# Patient Record
Sex: Male | Born: 1965 | Race: White | Hispanic: No | Marital: Married | Smoking: Never smoker
Health system: Southern US, Community
[De-identification: ages and names within clinical notes are randomized; demographics above are authoritative.]

## PROBLEM LIST (undated history)

## (undated) DIAGNOSIS — G43909 Migraine, unspecified, not intractable, without status migrainosus: Secondary | ICD-10-CM

## (undated) DIAGNOSIS — T7840XA Allergy, unspecified, initial encounter: Secondary | ICD-10-CM

## (undated) DIAGNOSIS — I10 Essential (primary) hypertension: Secondary | ICD-10-CM

## (undated) DIAGNOSIS — K219 Gastro-esophageal reflux disease without esophagitis: Secondary | ICD-10-CM

## (undated) DIAGNOSIS — R51 Headache: Secondary | ICD-10-CM

## (undated) DIAGNOSIS — R519 Headache, unspecified: Secondary | ICD-10-CM

## (undated) DIAGNOSIS — J45909 Unspecified asthma, uncomplicated: Secondary | ICD-10-CM

## (undated) HISTORY — DX: Headache: R51

## (undated) HISTORY — DX: Migraine, unspecified, not intractable, without status migrainosus: G43.909

## (undated) HISTORY — DX: Gastro-esophageal reflux disease without esophagitis: K21.9

## (undated) HISTORY — DX: Essential (primary) hypertension: I10

## (undated) HISTORY — PX: UVULOPALATOPHARYNGOPLASTY: SHX827

## (undated) HISTORY — DX: Headache, unspecified: R51.9

## (undated) HISTORY — DX: Allergy, unspecified, initial encounter: T78.40XA

## (undated) HISTORY — PX: OTHER SURGICAL HISTORY: SHX169

## (undated) HISTORY — DX: Unspecified asthma, uncomplicated: J45.909

---

## 2005-02-22 ENCOUNTER — Ambulatory Visit (HOSPITAL_BASED_OUTPATIENT_CLINIC_OR_DEPARTMENT_OTHER): Admission: RE | Admit: 2005-02-22 | Discharge: 2005-02-22 | Payer: Self-pay | Admitting: Emergency Medicine

## 2005-02-28 ENCOUNTER — Ambulatory Visit: Payer: Self-pay | Admitting: Internal Medicine

## 2006-02-10 ENCOUNTER — Encounter: Admission: RE | Admit: 2006-02-10 | Discharge: 2006-02-10 | Payer: Self-pay | Admitting: Otolaryngology

## 2006-03-18 ENCOUNTER — Ambulatory Visit (HOSPITAL_COMMUNITY): Admission: RE | Admit: 2006-03-18 | Discharge: 2006-03-20 | Payer: Self-pay | Admitting: Otolaryngology

## 2006-03-18 ENCOUNTER — Encounter (INDEPENDENT_AMBULATORY_CARE_PROVIDER_SITE_OTHER): Payer: Self-pay | Admitting: *Deleted

## 2006-06-30 ENCOUNTER — Ambulatory Visit (HOSPITAL_BASED_OUTPATIENT_CLINIC_OR_DEPARTMENT_OTHER): Admission: RE | Admit: 2006-06-30 | Discharge: 2006-06-30 | Payer: Self-pay | Admitting: Otolaryngology

## 2006-07-03 ENCOUNTER — Ambulatory Visit: Payer: Self-pay | Admitting: Internal Medicine

## 2009-05-13 ENCOUNTER — Encounter: Admission: RE | Admit: 2009-05-13 | Discharge: 2009-05-13 | Payer: Self-pay | Admitting: Orthopedic Surgery

## 2010-07-26 ENCOUNTER — Encounter: Payer: Self-pay | Admitting: Emergency Medicine

## 2010-08-18 ENCOUNTER — Other Ambulatory Visit: Payer: Self-pay | Admitting: Orthopedic Surgery

## 2010-08-18 DIAGNOSIS — M21371 Foot drop, right foot: Secondary | ICD-10-CM

## 2010-08-20 ENCOUNTER — Ambulatory Visit
Admission: RE | Admit: 2010-08-20 | Discharge: 2010-08-20 | Disposition: A | Discharge status: Home / Self Care | Payer: No Typology Code available for payment source | Source: Ambulatory Visit | Attending: Orthopedic Surgery | Admitting: Orthopedic Surgery

## 2010-08-20 DIAGNOSIS — M21371 Foot drop, right foot: Secondary | ICD-10-CM

## 2010-11-20 NOTE — Op Note (Signed)
NAME:  Anthony Whitaker, Anthony Whitaker                 ACCOUNT NO.:  [id number]   MEDICAL RECORD NO.:  [id number]          PATIENT TYPE:  OIB   LOCATION:  2111                         FACILITY:  [facility]   PHYSICIAN:  Antony Contras, MD     DATE OF BIRTH:  11/29/1965   DATE OF PROCEDURE:  03/18/2006  DATE OF DISCHARGE:                                 OPERATIVE REPORT   PREOPERATIVE DIAGNOSES:  1. Moderately severe obstructive sleep apnea  2. Septal deviation.  3. Inferior turbinate hypertrophy.   POSTOPERATIVE DIAGNOSES:  1. Moderately severe obstructive sleep apnea  2. Septal deviation.  3. Inferior turbinate hypertrophy.   PROCEDURE:  1. Nasal septoplasty.  2. Uvulopalatopharyngoplasty with tonsillectomy.  3. Hyoid suspension.  4. Genioglossus advancement.  5. Submucous resection of the inferior turbinates bilaterally.   SURGEON:  Dr. Christia Reading.   ANESTHESIA:  General endotracheal anesthesia.   COMPLICATIONS:  None.   INDICATIONS:  The patient is a 45 year old white male with a history of  moderately severe obstructive sleep apnea with a respiratory disturbance  index of about 44.  He has tried CPAP for many months and find the mask to  be intolerable.  He thus presents to the operating room for surgical  management of his moderately severe obstructive sleep apnea.   FINDINGS:  The patient has a small mouth, large tongue and a long soft  palate.  Tonsils were 2+ in size bilaterally.  The neck is thick, and he is  moderately obese.   DESCRIPTION OF PROCEDURE:  The patient is identified in the holding room,  and informed consent having been obtained, the patient was moved to the  operative suite and put on the operating table in supine position.  Anesthesia was induced, and the patient was intubated by anesthesia team  without difficulty.  The patient was given intravenous antibiotics and  steroids during the case.  The eyes taped closed, and the face and neck were  prepped and draped  in sterile fashion.  The hyoid incision was then marked  with a marking pen just overlying the superior thyroid cartilage.  This was  injected with 1% lidocaine with 1:100,000 of epinephrine.  The incision in  the skin was then made with a 15 blade scalpel and extended through the  subcutaneous fat using Bovie electrocautery.  The platysma muscle was  retracted to each direction, and the midline was divided down to the thyroid  cartilage.  The hyoid bone was then dissected with Bovie electrocautery  allowing it to be grasped.  The lesser cornu on each side was dissected in  order to allow mobilization of the hyoid anteriorly.  The thyroid cartilage  was then also cleared off of soft tissues.  The hyoid was then retracted  anteriorly and a #2 Ethibond suture was then used to make 4 lassoing sutures  around the hyoid bone and also through the superior thyroid cartilage.  Two  were placed on each side.  These were then tightened down, pulling the hyoid  bone anteriorly.  The surgical site was copiously irrigated with saline, and  a 7-French suction drain was then placed through a stab incision inferior  and to the left of the incision.  This was secured to the skin using a 3-0  nylon in a standard running stitch.  The midline was then closed with 3-0  Vicryl in a simple interrupted fashion.  Subcutaneous layer was also closed  in the same fashion.  Skin was closed with a running 5-0 nylon.  The drain  was hooked to bulb suction for the remainder of the case.  At this point,  the inferior gingivobuccal sulcus between the canine teeth was injected with  1% lidocaine with 1:100,000 epinephrine.  Incision was made through the  mucosa leaving a cuff of tissue to sew to using Bovie electrocautery.  This  was extended down through soft tissues directly onto the bone.  A Hurd  elevator was then used to elevate the soft tissues off of the inferior  mandible down to its margin.  Bleeding was controlled  with Bovie  electrocautery.  With the lip retracted inferiorly, a rectangular-shaped  osteotomy was then made with an oscillating saw with the lateral extent  being medial to the canines and the superior extent being well inferior to  the length of the incisors.  A strut of bone was maintained inferiorly as  well.  The rectangular-shaped piece of bone was then elevated anteriorly.  In doing so, the outer cortex was removed and discarded.  The inner cortex  was further elevated until it was able to be twisted in a 90-degree  orientation, locking it onto the outer cortex of the remaining mandible.  A  cutting bur was then used to bur down the bone until it was a fairly flat  piece of cancellous bone.  A drill bit for the 2.0 Leibinger mandible set  was then used to make a single drill hole superiorly and inferiorly on the  segment of bone and through the native mandible.  A depth gauge was used,  and appropriately length bone screws were then inserted and used 2 drill  holes securing the segment of bone.  The dissection area was then copiously  irrigated with saline.  Incision was closed with 3-0 Monocryl in a simple  running fashion.  Earlier in the case, Afrin-soaked pledgets were placed in  each side of the nose.  At this point, the pledgets were removed, and the  septum was injected on both sides of 1% lidocaine with 1:100,000  epinephrine.  A Killian incision was made on the right side using 15 blade  scalpel and extended down to the subperichondrial plane.  A subperichondrial  flap was then elevated using a Risk analyst followed by a Therapist, nutritional.  This was done back to the bony cartilaginous junction and beyond.  It was  also extended inferiorly to over the inferior spur.  The bony cartilaginous  junction was then divided using a Therapist, nutritional, and the subperiosteal flap  was elevated on the opposite side.  A segment of the posterior septal bone was then removed using a rongeur  followed by Lenoria Chime forceps.  An  osteotome was then used to remove the spur on the right side as well as the  inferior septal bone.  The incision anteriorly was then extended around the  caudal edge of the septal bone, and a subperichondrial flap was elevated on  the opposite side as well.  The flap was elevated down over the septal spur  inferiorly.  An osteotome was then used  to remove the septal spur.  This  allowed the septum to sit in midline without obstructing the airway.  At  this point, a stab incision was made through the right-sided flap to allow  drainage.  The incision was closed with 4-0 chromic suture in a simple  interrupted fashion.  The inferior turbinates were then injected with 1%  lidocaine with 1:100,000 of epinephrine on both sides.  A turbinate blade  microdebrider with cautery was then used, and the cautery was used to  penetrate the mucosa on the right side first.  The soft tissue was elevated  off the underlying bone using the blade.  The microdebrider was then used  simultaneously with bipolar cautery to remove submucosal tissue.  After this  was completed, the stab incision was also cauterized with the same  instrument.  The Freer elevator was used to lateralize the bone, and the  nasal passage was suctioned.  The same procedure was then carried out on the  left side in an identical fashion.  At this point, [facility] stents coated in  bacitracin were placed inside each side of the nose.  They were secured at  the anterior septum using a single 2-0 chromic suture and a mattress stitch.  At this point, the bed was turned 90 degrees from anesthesia, and the  endotracheal tube was loosened.  A Crowe-Davis retractor was then inserted  in the mouth and opened to reveal the oropharynx.  This was placed in  suspension on the Mayo stand.  The soft palate was then palpated, and the  estimated site for UPPP was marked with Bovie electrocautery.  The right  tonsil was then  grasped with a curved Allis and retracted medially.  Bovie  electrocautery was then used to make curvilinear incision along the anterior  tonsillar pillar and into the subcapsular plane dissecting the tonsil until  it was removed.  Tonsil sponge was placed on the right side.  The same  procedure was then carried out on the left side.  Tonsils were passed to  nursing separately for pathology.  The packs were then removed and bleeding  and superficial vessels cauterized using suction cautery on a setting of 30.  At this point, the uvula and soft palate were injected with 1% lidocaine  with 1:100,000 of epinephrine.  The anterior soft palate incision was then  extended to each tonsillar fossa using a 15 blade scalpel from the mark that  was made previously.  The soft palate tissues deep to the mucosa were then  incised with the 15 blade scalpel.  The incision was then skived inferiorly  through the posterior edge using the same instrument.  Back cuts were then made at the top of each tonsillar fossa and the mucosa using the blade.  A 3-  0 Vicryl suture was then used in a simple interrupted fashion to close the  posterior edge of soft palate to the anterior edge as well as starting by  tucking the corners up to the back cuts that were made.  Tonsillar fossae  were closed on both sides part way down as well.  After this, the nose and  throat were copiously irrigated with saline and suctioned out.  A  nasogastric tube was passed down the esophagus to suck out the stomach and  esophagus.  The Crowe-Davis retractor was then taken out of suspension and  removed from patient's mouth.  He was returned back to anesthesia for wake-  up and was extubated  and moved to the recovery room in stable condition.      Antony Contras, MD  Electronically Signed     DDB/MEDQ  D:  03/18/2006  T:  03/19/2006  Job:  [id number]

## 2010-11-20 NOTE — Procedures (Signed)
NAME:  Anthony Whitaker, Anthony Whitaker                 ACCOUNT NO.:  [id number]   MEDICAL RECORD NO.:  [id number]          PATIENT TYPE:  OUT   LOCATION:  SLEEP CENTER                 FACILITY:  [facility]   PHYSICIAN:  Clinton D. Maple Hudson, M.D. DATE OF BIRTH:  March 18, 1966   DATE OF STUDY:  02/22/2005                              NOCTURNAL POLYSOMNOGRAM   REFERRING PHYSICIAN:  Dr. Earl Lites   DATE OF STUDY:  February 22, 2005   INDICATION FOR STUDY:  Hypersomnia with sleep apnea. Epworth Sleepiness  Score 7/24, BMI 40, weight 300 pounds.   SLEEP ARCHITECTURE:  Total sleep time 366 minutes with sleep efficiency 85%.  Stage I 10%, stage II 60%, stages III and IV 17%, REM 13% of total sleep  time. Sleep latency 30 minutes, REM latency 217 minutes, awake after sleep  onset 33 minutes, arousal index 19. No bedtime medication.   RESPIRATORY DATA:  Split study protocol. Apnea-hypopnea index (AHI, RDI)  44.8 obstructive events per hour indicating moderately severe obstructive  sleep apnea/hypopnea syndrome before CPAP. This included 1 obstructive  apnea, 1 mixed apnea and 92 hypopneas before CPAP. He slept only supine. REM  RDI 6.2. CPAP was titrated to 17 CWP, AHI 5.2 per hour, using a medium  ResMed Ultra Mirage Full Face Mask with heated humidifier.   OXYGEN DATA:  Loud snoring and mouth breathing with oxygen desaturation to a  nadir of 67% on room air before CPAP. After CPAP control saturation held 94-  98% on room air.   CARDIAC DATA:  Sinus rhythm with frequent PVCs.   MOVEMENT/PARASOMNIA:  Occasional leg jerk with little effect on sleep. No  bathroom trips.   IMPRESSION/RECOMMENDATION:  1.  Moderately severe obstructive sleep apnea/hypopnea syndrome, apnea-      hypopnea index 44.8 per hour with loud snoring and oxygen desaturation      to 67%.  2.  Continuous positive airway pressure titration to 17 CWP, apnea-hypopnea      index 5.2 per hour, using a medium      ResMed Ultra Mirage Full Face Mask with  heated humidifier.  3.  Frequent PVCs.      Clinton D. Maple Hudson, M.D.  Diplomate, Biomedical engineer of Sleep Medicine  Electronically Signed     CDY/MEDQ  D:  02/28/2005 11:07:47  T:  02/28/2005 22:59:54  Job:  [id number]

## 2010-11-20 NOTE — Procedures (Signed)
NAME:  Anthony Whitaker, Anthony Whitaker                 ACCOUNT NO.:  [id number]   MEDICAL RECORD NO.:  [id number]          PATIENT TYPE:  OUT   LOCATION:  SLEEP CENTER                 FACILITY:  [facility]   PHYSICIAN:  Clinton D. Maple Hudson, MD, FCCP, FACPDATE OF BIRTH:  [birth year]-Sep[birth year]1967   DATE OF STUDY:  06/30/2006                            NOCTURNAL POLYSOMNOGRAM   INDICATION FOR STUDY:  Hypersomnia with sleep apnea.   EPWORTH SLEEPINESS SCORE:  11/24.   BMI:  41.9.  Weight 300 pounds.   HOME MEDICATIONS:  Advair, Singulair, Zegerid, Flonase.   SLEEP ARCHITECTURE:  Total sleep time 360 minutes, with sleep efficiency  83%.  Stage I was 7%, stage II 65%, stages III and IV 13%, REM 15% of  total sleep time.  Sleep latency 42 minutes, REM latency 205 minutes,  awake after sleep onset 31 minutes, arousal index 20.8.  No bedtime  medication was taken.   RESPIRATORY DATA:  Split study protocol.  Apnea-hypopnea index (AHI,  RDI) 98.5 obstructive events per hour, indicating severe obstructive  sleep apnea/hypopnea syndrome before CPAP.  This included 211  obstructive apneas and 4 hypopneas before CPAP.  Events were equally  common while supine and sleeping on left side.  REM AHI 3.3 per hour.  CPAP was titrated to 13 CWP, AHI 17.5 per hour.  Optimum control was  obtained at 9 CWP, AHI 0 per hour.  The patient used his own full face  Ultra Mirage mask.  Heated humidifier was added.   OXYGEN DATA:  Moderate snoring, with oxygen saturation to a nadir of  81%.  Mean oxygen saturation during CPAP control as 95% on room air.   CARDIAC DATA:  Normal sinus rhythm.   MOVEMENT-PARASOMNIA:  Rare limb jerk, insignificant.   IMPRESSIONS-RECOMMENDATIONS:  1. Severe obstructive sleep apnea/hypopnea syndrome, AHI 98.5 per      hour, with non-positional events, moderate snoring, and oxygen      desaturation to 81%.  2. CPAP was titrated to 13 CWP.  Apnea index was climbing as pressures      were increased.  Optimum pressure  appeared to be 9 CWP, AHI 0 per hour.  He used his own medium full      face Ultra Mirage mask, and a heated humidifier was applied.      Clinton D. Maple Hudson, MD, [facility], FACP  Diplomate, Biomedical engineer of Sleep Medicine  Electronically Signed     CDY/MEDQ  D:  07/03/2006 16:31:38  T:  07/04/2006 08:33:51  Job:  [id number]

## 2012-03-25 ENCOUNTER — Ambulatory Visit: Payer: Self-pay | Admitting: Emergency Medicine

## 2012-03-25 ENCOUNTER — Encounter: Payer: Self-pay | Admitting: Emergency Medicine

## 2012-03-25 VITALS — BP 128/74 | HR 55 | Temp 98.0°F | Resp 16 | Ht 71.25 in | Wt 297.2 lb

## 2012-03-25 DIAGNOSIS — E663 Overweight: Secondary | ICD-10-CM

## 2012-03-25 DIAGNOSIS — I10 Essential (primary) hypertension: Secondary | ICD-10-CM

## 2012-03-25 DIAGNOSIS — J45909 Unspecified asthma, uncomplicated: Secondary | ICD-10-CM

## 2012-03-25 DIAGNOSIS — Z23 Encounter for immunization: Secondary | ICD-10-CM

## 2012-03-25 MED ORDER — BECLOMETHASONE DIPROPIONATE 80 MCG/ACT IN AERS: 2.0000 | INHALATION_SPRAY | RESPIRATORY_TRACT | Status: DC | PRN

## 2012-03-25 MED ORDER — METOPROLOL TARTRATE 25 MG PO TABS: 25.0000 mg | ORAL_TABLET | Freq: Two times a day (BID) | ORAL | Status: DC

## 2012-03-25 MED ORDER — ALBUTEROL SULFATE HFA 108 (90 BASE) MCG/ACT IN AERS: 2.0000 | INHALATION_SPRAY | RESPIRATORY_TRACT | Status: DC | PRN

## 2012-03-25 NOTE — Progress Notes (Signed)
  Subjective:    Patient ID: Anthony Whitaker, male    DOB: 16-Nov-1965, 46 y.o.   MRN: [id number]  HPI Pt rtc for hypertension f/u and medication refill. Feeling well, blood pressure has been normal,  denies chest pain, sob, Works in office, very limited physical exercise.  bp rechecked in room: 118/80   Review of Systems patient does have a history of sleep apnea. He's had sleep apnea surgery he states he has not had any followup testing done but does feel markedly improved after having had surgery done. He did have questions regarding taking a beta blocker and questions as to whether felt like this was the source of his fatigue     Objective:   Physical Exam HEENT exam unremarkable chest is clear . Heart regular no murmurs rubs or gallops appreciated. Abdomen is soft nontender extremities without edema        Assessment & Plan:  Blood pressure under good control on current medications. Will refill meds for a year. We will place for future we're going to have a CBC, cmet,  lipids in the near future

## 2012-03-25 NOTE — Patient Instructions (Addendum)

## 2012-04-15 ENCOUNTER — Ambulatory Visit: Payer: Self-pay | Admitting: Emergency Medicine

## 2012-04-15 DIAGNOSIS — E663 Overweight: Secondary | ICD-10-CM

## 2012-04-15 DIAGNOSIS — I10 Essential (primary) hypertension: Secondary | ICD-10-CM

## 2012-04-15 LAB — POCT CBC
Granulocyte percent: 66.3 %G (ref 37–80)
MCV: 93.7 fL (ref 80–97)
MID (cbc): 0.8 (ref 0–0.9)
MPV: 10.9 fL (ref 0–99.8)
POC Granulocyte: 5.8 (ref 2–6.9)
POC LYMPH PERCENT: 24.6 %L (ref 10–50)
POC MID %: 9.1 %M (ref 0–12)
Platelet Count, POC: 313 10*3/uL (ref 142–424)
RBC: 4.86 M/uL (ref 4.69–6.13)
RDW, POC: 12.4 %

## 2012-04-15 LAB — COMPREHENSIVE METABOLIC PANEL
ALT: 22 U/L (ref 0–53)
AST: 17 U/L (ref 0–37)
Albumin: 4 g/dL (ref 3.5–5.2)
Alkaline Phosphatase: 54 U/L (ref 39–117)
BUN: 18 mg/dL (ref 6–23)
Calcium: 9.3 mg/dL (ref 8.4–10.5)
Chloride: 106 mEq/L (ref 96–112)
Potassium: 4.6 mEq/L (ref 3.5–5.3)
Sodium: 142 mEq/L (ref 135–145)
Total Protein: 6.4 g/dL (ref 6.0–8.3)

## 2012-04-15 LAB — POCT GLYCOSYLATED HEMOGLOBIN (HGB A1C): Hemoglobin A1C: 5.5

## 2012-04-15 LAB — LIPID PANEL
HDL: 28 mg/dL — ABNORMAL LOW (ref >39)
LDL Cholesterol: 92 mg/dL (ref 0–99)

## 2012-04-17 ENCOUNTER — Encounter: Payer: Self-pay | Admitting: Physician Assistant

## 2013-02-01 ENCOUNTER — Ambulatory Visit (INDEPENDENT_AMBULATORY_CARE_PROVIDER_SITE_OTHER): Payer: BC Managed Care – PPO | Admitting: Family Medicine

## 2013-02-01 ENCOUNTER — Encounter: Payer: Self-pay | Admitting: Family Medicine

## 2013-02-01 VITALS — BP 120/80 | HR 64 | Temp 97.7°F | Ht 71.75 in | Wt 308.6 lb

## 2013-02-01 DIAGNOSIS — M129 Arthropathy, unspecified: Secondary | ICD-10-CM

## 2013-02-01 DIAGNOSIS — H9190 Unspecified hearing loss, unspecified ear: Secondary | ICD-10-CM

## 2013-02-01 DIAGNOSIS — J45909 Unspecified asthma, uncomplicated: Secondary | ICD-10-CM

## 2013-02-01 DIAGNOSIS — I10 Essential (primary) hypertension: Secondary | ICD-10-CM

## 2013-02-01 DIAGNOSIS — M47812 Spondylosis without myelopathy or radiculopathy, cervical region: Secondary | ICD-10-CM

## 2013-02-01 DIAGNOSIS — H9193 Unspecified hearing loss, bilateral: Secondary | ICD-10-CM

## 2013-02-01 DIAGNOSIS — J4520 Mild intermittent asthma, uncomplicated: Secondary | ICD-10-CM

## 2013-02-01 MED ORDER — MELOXICAM 15 MG PO TABS: 15.0000 mg | ORAL_TABLET | Freq: Every day | ORAL | Status: DC

## 2013-02-01 MED ORDER — METOPROLOL TARTRATE 25 MG PO TABS: 25.0000 mg | ORAL_TABLET | Freq: Two times a day (BID) | ORAL | Status: DC

## 2013-02-01 MED ORDER — ALBUTEROL SULFATE HFA 108 (90 BASE) MCG/ACT IN AERS: 2.0000 | INHALATION_SPRAY | RESPIRATORY_TRACT | Status: DC | PRN

## 2013-02-01 MED ORDER — BECLOMETHASONE DIPROPIONATE 80 MCG/ACT IN AERS: 2.0000 | INHALATION_SPRAY | RESPIRATORY_TRACT | Status: DC | PRN

## 2013-02-01 NOTE — Progress Notes (Signed)
  Subjective:    Patient ID: Anthony Whitaker, male    DOB: Oct 19, 1965, 47 y.o.   MRN: [id number]  HPI New to establish.  Previous MD- Daub.  Hearing- has regular hearing tests at work and last test showed notable hearing loss bilaterally.  Pt now having increased difficulty w/ 'certain frequencies'- 'pretty much human speech'.  Cervical bone spur- previously was seeing Dr Shon Baton, C4/C5.  Now having increased numbness of UEs bilaterally and weakness bilaterally but L >R.  Not currently taking anything for inflammation or pain.  HTN- chronic problem, on Metoprolol twice daily.  Denies abd pain, N/V, SOB, HAs visual changes, edema.   Review of Systems For ROS see HPI     Objective:   Physical Exam  Vitals reviewed. Constitutional: He is oriented to person, place, and time. He appears well-developed and well-nourished. No distress.  HENT:  Head: Normocephalic and atraumatic.  Eyes: Conjunctivae and EOM are normal. Pupils are equal, round, and reactive to light.  Neck: Normal range of motion. Neck supple. No thyromegaly present.  Cardiovascular: Normal rate, regular rhythm, normal heart sounds and intact distal pulses.   No murmur heard. Pulmonary/Chest: Effort normal and breath sounds normal. No respiratory distress.  Abdominal: Soft. Bowel sounds are normal. He exhibits no distension.  Musculoskeletal: He exhibits no edema.  Lymphadenopathy:    He has no cervical adenopathy.  Neurological: He is alert and oriented to person, place, and time. He has normal reflexes. No cranial nerve deficit.  Skin: Skin is warm and dry.  Psychiatric: He has a normal mood and affect. His behavior is normal.          Assessment & Plan:

## 2013-02-01 NOTE — Patient Instructions (Signed)
Schedule your complete physical in 6 months We'll call you with your ortho appt Start the Mobic daily for neck pain/inflammation We'll call you with your audiology appt We'll notify you of your lab results Call with any questions or concerns Think of [place] as your home base Welcome!  We're glad to have you!!!

## 2013-02-02 LAB — HEPATIC FUNCTION PANEL
AST: 28 U/L (ref 0–37)
Alkaline Phosphatase: 51 U/L (ref 39–117)
Bilirubin, Direct: 0 mg/dL (ref 0.0–0.3)
Total Protein: 7 g/dL (ref 6.0–8.3)

## 2013-02-02 LAB — CBC WITH DIFFERENTIAL/PLATELET
Basophils Absolute: 0.1 10*3/uL (ref 0.0–0.1)
Eosinophils Relative: 7.7 % — ABNORMAL HIGH (ref 0.0–5.0)
HCT: 39.6 % (ref 39.0–52.0)
Hemoglobin: 13.3 g/dL (ref 13.0–17.0)
Lymphocytes Relative: 28.2 % (ref 12.0–46.0)
Monocytes Relative: 9.3 % (ref 3.0–12.0)
Neutro Abs: 3.8 10*3/uL (ref 1.4–7.7)
RDW: 12.9 % (ref 11.5–14.6)
WBC: 7 10*3/uL (ref 4.5–10.5)

## 2013-02-02 LAB — LIPID PANEL
HDL: 29.3 mg/dL — ABNORMAL LOW (ref >39.00)
LDL Cholesterol: 109 mg/dL — ABNORMAL HIGH (ref 0–99)
Total CHOL/HDL Ratio: 6
Triglycerides: 145 mg/dL (ref 0.0–149.0)

## 2013-02-02 LAB — BASIC METABOLIC PANEL
CO2: 27 mEq/L (ref 19–32)
Chloride: 105 mEq/L (ref 96–112)
Glucose, Bld: 98 mg/dL (ref 70–99)
Sodium: 139 mEq/L (ref 135–145)

## 2013-02-03 NOTE — Assessment & Plan Note (Signed)
New to provider, ongoing for pt.  Well controlled today.  Refill meds.  Check labs to risk stratify.  Will follow.

## 2013-02-03 NOTE — Assessment & Plan Note (Signed)
New.  Refer to audiology for complete evaluation and possible tx.

## 2013-02-03 NOTE — Assessment & Plan Note (Signed)
New to provider, ongoing for pt.  Refer back to Dr Shon Baton and possibly neurosurg in the future.  Start once daily anti-inflammatory

## 2013-02-03 NOTE — Assessment & Plan Note (Signed)
New to provider, ongoing for pt.  Refill provided on inhalers.

## 2013-03-12 ENCOUNTER — Ambulatory Visit: Payer: BC Managed Care – PPO | Attending: Family Medicine | Admitting: Audiology

## 2013-03-12 DIAGNOSIS — H905 Unspecified sensorineural hearing loss: Secondary | ICD-10-CM

## 2013-03-12 DIAGNOSIS — H906 Mixed conductive and sensorineural hearing loss, bilateral: Secondary | ICD-10-CM | POA: Diagnosis not present

## 2013-03-12 DIAGNOSIS — H903 Sensorineural hearing loss, bilateral: Secondary | ICD-10-CM
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
## 2013-03-12 NOTE — Patient Instructions (Addendum)
Normal hearing in the low and high frequencies with a mid range hearing loss bilaterally. Excellent word recognition in quiet that becomes fair in the right ear in minimal background noise.  Inexpensive Auditory processing self-help computer programs are now available for IPAD and computer download, more are being developed.  Benenfit has been shown with intensive use for 10-15 minutes,  4-5 days per week for 5-8 weeks for each of these programs.  Research is suggesting that using the programs for a short amount of time each day is better for the auditory processing development than completing the program in a short amount of time by doing it several hours per day. Auditory Workout          IPAD only from [email or web address]  IPAD or PC download ( Auditory memory which includes hearing in background noise sessions)        To help monitor progress at home please go to www.hear-it.org . Take the "hearing test" which has varying background noise before starting therapy and then again later.  Recent research has shown the hearing test valid for monitoring.  If no significant improvement, please contact me for further testing and/or recommendations.  Additional testing and or other auditory processing interventions may be needed or be more effective.  I Strategies that help improve hearing include: A) Face the speaker directly. Optimal is having the speakers face well - lit.  Unless amplified, being within 3-6 feet of the speaker will enhance word recognition. B) Avoid having the speaker back-lit as this will minimize the ability to use cues from lip-reading, facial expression and gestures. C)  Word recognition is poorer in background noise. For optimal word recognition, turn off the TV, radio or noisy fan when engaging in conversation. In a restaurant, try to sit away from noise sources and close to the primary speaker.  D)  Ask for topic clarification from time to time in order to remain in the  conversation.  Most people don't mind repeating or clarifying a point when asked.  If needed, explain the difficulty hearing in background noise or hearing loss.  Amplification helps make the signal louder and therefore often improves hearing and word recognition.  Amplification has many forms including hearing aids in one or both ears, an assistive listening device which have a microphone and speaker such as a small handheld device and/or even a surround sound system of speakers.  Amplification may be covered by some insurances, but not all.  It is important to note that hearing aids must be individually fit according to the hearing test results and the ear shape.  Audiologists and hearing aid dealers in [place] must be licensed in order to dispense hearing aids.  In addition, a trial period is mandated by law in our state because often amplification must be tried and then evaluated in order to determine benefit.  There are many excellent choices when it comes to amplification in our area and providers are listed in the phone book under hearing aids, may be affiliated with Ear, Nose and Throat physicians, are located at [facility] and [employer] as well as the [employer] [facility] speech and hearing center.  Hearing Loss A hearing loss is sometimes called deafness. Hearing loss may be partial or total. CAUSES Hearing loss may be caused by:  Wax in the ear canal.  Infection of the ear canal.  Infection of the middle ear.  Trauma to the ear or surrounding area.  Fluid in the middle ear.  A  hole in the eardrum (perforated eardrum).  Exposure to loud sounds or music.  Problems with the hearing nerve.  Certain medications. Hearing loss without wax, infection, or a history of injury may mean that the nerve is involved. Hearing loss with severe dizziness, nausea and vomiting or ringing in the ear may suggest a hearing nerve irritation or problems in the middle or inner ear. If hearing loss is  untreated, there is a greater likelihood for residual or permanent hearing loss. DIAGNOSIS A hearing test (audiometry) assesses hearing loss. The audiometry test needs to be performed by a hearing specialist (audiologist). TREATMENT Treatment for recent onset of hearing loss may include:  Ear wax removal.  Medications that kill germs (antibiotics).  Cortisone medications.  Prompt follow up with the appropriate specialist. Return of hearing depends on the cause of your hearing loss, so proper medical follow-up is important. Some hearing loss may not be reversible, and a caregiver should discuss care and treatment options with you. SEEK MEDICAL CARE IF:   You have a severe headache, dizziness, or changes in vision.  You have new or increased weakness.  You develop repeated vomiting or other serious medical problems.  You have a fever. Document Released: 06/21/2005 Document Revised: 09/13/2011 Document Reviewed: 10/16/2009 [facility] Patient Information 2014 [place], [place]. Hearing Loss A hearing loss is sometimes called deafness. Hearing loss may be partial or total. CAUSES Hearing loss may be caused by:  Wax in the ear canal.  Infection of the ear canal.  Infection of the middle ear.  Trauma to the ear or surrounding area.  Fluid in the middle ear.  A hole in the eardrum (perforated eardrum).  Exposure to loud sounds or music.  Problems with the hearing nerve.  Certain medications. Hearing loss without wax, infection, or a history of injury may mean that the nerve is involved. Hearing loss with severe dizziness, nausea and vomiting or ringing in the ear may suggest a hearing nerve irritation or problems in the middle or inner ear. If hearing loss is untreated, there is a greater likelihood for residual or permanent hearing loss. DIAGNOSIS A hearing test (audiometry) assesses hearing loss. The audiometry test needs to be performed by a hearing specialist  (audiologist). TREATMENT Treatment for recent onset of hearing loss may include:  Ear wax removal.  Medications that kill germs (antibiotics).  Cortisone medications.  Prompt follow up with the appropriate specialist. Return of hearing depends on the cause of your hearing loss, so proper medical follow-up is important. Some hearing loss may not be reversible, and a caregiver should discuss care and treatment options with you. SEEK MEDICAL CARE IF:   You have a severe headache, dizziness, or changes in vision.  You have new or increased weakness.  You develop repeated vomiting or other serious medical problems.  You have a fever. Document Released: 06/21/2005 Document Revised: 09/13/2011 Document Reviewed: 10/16/2009 [facility] Patient Information 2014 [place], [place].   Zaki Gertsch L. Kate Sable, Au.D., CCC-A Doctor of Audiology

## 2013-06-28 ENCOUNTER — Other Ambulatory Visit: Payer: Self-pay | Admitting: Family Medicine

## 2013-06-29 NOTE — Telephone Encounter (Signed)
Med filled.  

## 2013-07-02 ENCOUNTER — Telehealth: Payer: Self-pay | Admitting: Family Medicine

## 2013-07-02 NOTE — Telephone Encounter (Signed)
Ok for pt to receive tdap, can you schedule this and also a CPE

## 2013-07-02 NOTE — Telephone Encounter (Signed)
Pt has cpe scheduled for 08/03/13. Coming for tdap on 07/12/13.

## 2013-07-02 NOTE — Telephone Encounter (Signed)
Patient is expecting a baby in January and would like to come get tdap. Is this okay?

## 2013-07-12 ENCOUNTER — Ambulatory Visit (INDEPENDENT_AMBULATORY_CARE_PROVIDER_SITE_OTHER): Payer: BC Managed Care – PPO

## 2013-07-12 DIAGNOSIS — Z23 Encounter for immunization: Secondary | ICD-10-CM

## 2013-08-01 ENCOUNTER — Telehealth: Payer: Self-pay

## 2013-08-01 NOTE — Telephone Encounter (Signed)
Medication List and allergies:  Updated and Reviewed  90 day supply/mail order: n/a Local prescriptions: [facility] on [address]  Immunization due: UTD  A/P: No changes to personal, family or PSH Flu- 03/2013- per patient; received at work Tdap- 07/12/13 PSA- Not found in record.   To discuss with provider: Would like to discuss referral for ENT; sleep study.

## 2013-08-01 NOTE — Telephone Encounter (Signed)
Left message for call back. Identifiable  Flu-Due Tdap- 07/12/13

## 2013-08-03 ENCOUNTER — Encounter: Payer: Self-pay | Admitting: Family Medicine

## 2013-08-03 ENCOUNTER — Ambulatory Visit (INDEPENDENT_AMBULATORY_CARE_PROVIDER_SITE_OTHER): Payer: BC Managed Care – PPO | Admitting: Family Medicine

## 2013-08-03 VITALS — BP 120/80 | HR 68 | Temp 98.2°F | Resp 16 | Ht 72.0 in | Wt 308.5 lb

## 2013-08-03 DIAGNOSIS — Z0000 Encounter for general adult medical examination without abnormal findings: Secondary | ICD-10-CM

## 2013-08-03 DIAGNOSIS — G4733 Obstructive sleep apnea (adult) (pediatric): Secondary | ICD-10-CM

## 2013-08-03 LAB — CBC WITH DIFFERENTIAL/PLATELET
BASOS PCT: 0.6 % (ref 0.0–3.0)
Basophils Absolute: 0 10*3/uL (ref 0.0–0.1)
EOS ABS: 0.4 10*3/uL (ref 0.0–0.7)
Eosinophils Relative: 6.3 % — ABNORMAL HIGH (ref 0.0–5.0)
HEMATOCRIT: 45.5 % (ref 39.0–52.0)
Hemoglobin: 14.7 g/dL (ref 13.0–17.0)
LYMPHS ABS: 1.9 10*3/uL (ref 0.7–4.0)
Lymphocytes Relative: 30.7 % (ref 12.0–46.0)
MCHC: 32.4 g/dL (ref 30.0–36.0)
MCV: 91.5 fl (ref 78.0–100.0)
MONO ABS: 0.6 10*3/uL (ref 0.1–1.0)
Monocytes Relative: 10 % (ref 3.0–12.0)
NEUTROS ABS: 3.3 10*3/uL (ref 1.4–7.7)
NEUTROS PCT: 52.4 % (ref 43.0–77.0)
Platelets: 295 10*3/uL (ref 150.0–400.0)
RBC: 4.97 Mil/uL (ref 4.22–5.81)
RDW: 13.1 % (ref 11.5–14.6)
WBC: 6.2 10*3/uL (ref 4.5–10.5)

## 2013-08-03 LAB — BASIC METABOLIC PANEL
BUN: 15 mg/dL (ref 6–23)
CALCIUM: 9.8 mg/dL (ref 8.4–10.5)
CO2: 24 mEq/L (ref 19–32)
Chloride: 107 mEq/L (ref 96–112)
Creatinine, Ser: 0.9 mg/dL (ref 0.4–1.5)
GFR: 93.31 mL/min (ref >60.00)
Glucose, Bld: 87 mg/dL (ref 70–99)
POTASSIUM: 4.8 meq/L (ref 3.5–5.1)
SODIUM: 141 meq/L (ref 135–145)

## 2013-08-03 LAB — HEPATIC FUNCTION PANEL
ALT: 34 U/L (ref 0–53)
AST: 27 U/L (ref 0–37)
Albumin: 3.9 g/dL (ref 3.5–5.2)
Alkaline Phosphatase: 52 U/L (ref 39–117)
BILIRUBIN DIRECT: 0 mg/dL (ref 0.0–0.3)
BILIRUBIN TOTAL: 0.7 mg/dL (ref 0.3–1.2)
Total Protein: 6.8 g/dL (ref 6.0–8.3)

## 2013-08-03 LAB — LIPID PANEL
CHOL/HDL RATIO: 5
Cholesterol: 174 mg/dL (ref 0–200)
HDL: 34.1 mg/dL — ABNORMAL LOW (ref >39.00)
LDL Cholesterol: 125 mg/dL — ABNORMAL HIGH (ref 0–99)
TRIGLYCERIDES: 77 mg/dL (ref 0.0–149.0)
VLDL: 15.4 mg/dL (ref 0.0–40.0)

## 2013-08-03 LAB — PSA: PSA: 1.37 ng/mL (ref 0.10–4.00)

## 2013-08-03 LAB — TSH: TSH: 2.07 u[IU]/mL (ref 0.35–5.50)

## 2013-08-03 NOTE — Assessment & Plan Note (Signed)
New to provider, ongoing for pt.  Had surgery for turbinate reduction but pt feels this is no longer effective.  Not interested in CPAP machine b/c he doesn't sleep on his back.  Refer back to ENT for assessment and tx.

## 2013-08-03 NOTE — Patient Instructions (Signed)
Follow up in 6 months to recheck BP We'll notify you of your lab results and make any changes if needed Try and get regular exercise and make healthy food choices We'll call you with your ENT appt for the sleep apnea Call with any questions or concerns CONGRATS on the new baby!

## 2013-08-03 NOTE — Assessment & Plan Note (Signed)
Pt's PE WNL w/ exception of morbid obesity.  Check labs.  Anticipatory guidance provided.  

## 2013-08-03 NOTE — Assessment & Plan Note (Signed)
New to provider, ongoing for pt.  Encouraged regular, aerobic activity for 30 minutes at least 4x/week and monitoring caloric intake w/ the help of MyFitnessPal app.  

## 2013-08-03 NOTE — Progress Notes (Signed)
   Subjective:    Patient ID: Anthony Whitaker, male    DOB: [birth year], 48 y.o.   MRN: [id number]  HPI CPE- pt has hx of OSA and had nasal surgery (turbinate reduction) which 'made a huge difference but not enough'.  'they've grown back'.  Pt again having breathing pauses.     Review of Systems Patient reports no  vision/ hearing changes,anorexia, weight change, fever ,adenopathy, persistant / recurrent hoarseness, swallowing issues, chest pain,palpitations, edema,persistant / recurrent cough, hemoptysis, dyspnea(rest, exertional, paroxysmal nocturnal), gastrointestinal  bleeding (melena, rectal bleeding), abdominal pain, GU symptoms( dysuria, hematuria, pyuria, voiding/incontinence  Issues) syncope, focal weakness, memory loss,numbness & tingling, skin/hair/nail changes,depression, anxiety, abnormal bruising/bleeding, musculoskeletal symptoms/signs.  +GERD- pt reports sxs will 'build every 3-4 months', will take 2 week course of omeprazole and things will improve     Objective:   Physical Exam BP 120/80  Pulse 68  Temp(Src) 98.2 F (36.8 C) (Oral)  Resp 16  Ht 6' (1.829 m)  Wt 308 lb 8 oz (139.935 kg)  BMI 41.83 kg/m2  SpO2 96%  General Appearance:    Alert, cooperative, no distress, appears stated age, morbidly obese  Head:    Normocephalic, without obvious abnormality, atraumatic  Eyes:    PERRL, conjunctiva/corneas clear, EOM's intact, fundi    benign, both eyes       Ears:    Normal TM's and external ear canals, both ears  Nose:   Nares normal, septum midline, mucosa normal, no drainage   or sinus tenderness  Throat:   Lips, mucosa, and tongue normal; teeth and gums normal; very narrow opening between tongue and palate  Neck:   Supple, symmetrical, trachea midline, no adenopathy;       thyroid:  No enlargement/tenderness/nodules  Back:     Symmetric, no curvature, ROM normal, no CVA tenderness  Lungs:     Clear to auscultation bilaterally, respirations unlabored  Chest wall:     No tenderness or deformity  Heart:    Regular rate and rhythm, S1 and S2 normal, no murmur, rub   or gallop  Abdomen:     Soft, non-tender, bowel sounds active all four quadrants,    no masses, no organomegaly  Genitalia:    Normal male without lesion, discharge or tenderness  Rectal:    Normal tone, normal prostate, no masses or tenderness  Extremities:   Extremities normal, atraumatic, no cyanosis or edema  Pulses:   2+ and symmetric all extremities  Skin:   Skin color, texture, turgor normal, no rashes or lesions  Lymph nodes:   Cervical, supraclavicular, and axillary nodes normal  Neurologic:   CNII-XII intact. Normal strength, sensation and reflexes      throughout          Assessment & Plan:

## 2013-08-03 NOTE — Progress Notes (Signed)
Pre visit review using our clinic review tool, if applicable. No additional management support is needed unless otherwise documented below in the visit note. 

## 2013-08-06 ENCOUNTER — Encounter: Payer: Self-pay | Admitting: General Practice

## 2013-11-02 ENCOUNTER — Other Ambulatory Visit: Payer: Self-pay | Admitting: Family Medicine

## 2013-11-02 NOTE — Telephone Encounter (Signed)
Med filled.  

## 2014-01-31 ENCOUNTER — Encounter: Payer: Self-pay | Admitting: General Practice

## 2014-01-31 ENCOUNTER — Ambulatory Visit (INDEPENDENT_AMBULATORY_CARE_PROVIDER_SITE_OTHER): Payer: BC Managed Care – PPO | Admitting: Family Medicine

## 2014-01-31 ENCOUNTER — Encounter: Payer: Self-pay | Admitting: Family Medicine

## 2014-01-31 DIAGNOSIS — I10 Essential (primary) hypertension: Secondary | ICD-10-CM

## 2014-01-31 DIAGNOSIS — Z91018 Allergy to other foods: Secondary | ICD-10-CM | POA: Insufficient documentation

## 2014-01-31 DIAGNOSIS — J4520 Mild intermittent asthma, uncomplicated: Secondary | ICD-10-CM

## 2014-01-31 DIAGNOSIS — J45909 Unspecified asthma, uncomplicated: Secondary | ICD-10-CM

## 2014-01-31 LAB — BASIC METABOLIC PANEL
BUN: 17 mg/dL (ref 6–23)
CHLORIDE: 108 meq/L (ref 96–112)
CO2: 28 mEq/L (ref 19–32)
Calcium: 9.1 mg/dL (ref 8.4–10.5)
Creatinine, Ser: 1.1 mg/dL (ref 0.4–1.5)
GFR: 80.94 mL/min (ref >60.00)
Glucose, Bld: 101 mg/dL — ABNORMAL HIGH (ref 70–99)
POTASSIUM: 4.1 meq/L (ref 3.5–5.1)
Sodium: 140 mEq/L (ref 135–145)

## 2014-01-31 LAB — LIPID PANEL
Cholesterol: 168 mg/dL (ref 0–200)
HDL: 28 mg/dL — ABNORMAL LOW (ref >39.00)
LDL CALC: 118 mg/dL — AB (ref 0–99)
NONHDL: 140
Total CHOL/HDL Ratio: 6
Triglycerides: 111 mg/dL (ref 0.0–149.0)
VLDL: 22.2 mg/dL (ref 0.0–40.0)

## 2014-01-31 LAB — CBC WITH DIFFERENTIAL/PLATELET
Basophils Absolute: 0 10*3/uL (ref 0.0–0.1)
Basophils Relative: 0.6 % (ref 0.0–3.0)
Eosinophils Absolute: 0.4 10*3/uL (ref 0.0–0.7)
Eosinophils Relative: 6.3 % — ABNORMAL HIGH (ref 0.0–5.0)
HEMATOCRIT: 41.1 % (ref 39.0–52.0)
Hemoglobin: 13.6 g/dL (ref 13.0–17.0)
LYMPHS ABS: 1.6 10*3/uL (ref 0.7–4.0)
Lymphocytes Relative: 24 % (ref 12.0–46.0)
MCHC: 33.2 g/dL (ref 30.0–36.0)
MCV: 89.9 fl (ref 78.0–100.0)
MONO ABS: 0.6 10*3/uL (ref 0.1–1.0)
Monocytes Relative: 9.5 % (ref 3.0–12.0)
NEUTROS PCT: 59.6 % (ref 43.0–77.0)
Neutro Abs: 3.9 10*3/uL (ref 1.4–7.7)
Platelets: 286 10*3/uL (ref 150.0–400.0)
RBC: 4.57 Mil/uL (ref 4.22–5.81)
RDW: 12.4 % (ref 11.5–15.5)
WBC: 6.6 10*3/uL (ref 4.0–10.5)

## 2014-01-31 LAB — POCT URINALYSIS DIPSTICK
Bilirubin, UA: NEGATIVE
Blood, UA: NEGATIVE
Glucose, UA: NEGATIVE
Ketones, UA: NEGATIVE
LEUKOCYTES UA: NEGATIVE
Nitrite, UA: NEGATIVE
PH UA: 6.5
PROTEIN UA: NEGATIVE
Spec Grav, UA: <=1.005
Urobilinogen, UA: 0.2

## 2014-01-31 LAB — HEPATIC FUNCTION PANEL
ALBUMIN: 3.7 g/dL (ref 3.5–5.2)
ALK PHOS: 51 U/L (ref 39–117)
ALT: 26 U/L (ref 0–53)
AST: 21 U/L (ref 0–37)
BILIRUBIN DIRECT: 0 mg/dL (ref 0.0–0.3)
Total Bilirubin: 0.4 mg/dL (ref 0.2–1.2)
Total Protein: 6.9 g/dL (ref 6.0–8.3)

## 2014-01-31 LAB — HEMOGLOBIN A1C: Hgb A1c MFr Bld: 6 % (ref 4.6–6.5)

## 2014-01-31 LAB — TSH: TSH: 2.36 u[IU]/mL (ref 0.35–4.50)

## 2014-01-31 NOTE — Assessment & Plan Note (Signed)
New.  Pt would like to be evaluated for this b/c he states he is unable to eat nuts due to abdominal pain.  No rash, facial swelling, SOB.  Will do blood panel to assess.  Will follow.

## 2014-01-31 NOTE — Assessment & Plan Note (Signed)
Chronic problem.  Adequate control.  Pt now having worsening SOB and edema.  Discussed need for weight loss.  No anticipated med changes at this time.  Will follow.

## 2014-01-31 NOTE — Assessment & Plan Note (Signed)
Deteriorated.  Pt continues to gain weight despite discussing this at last OV.  Pt reports he is not exercising nor following regular diet.  Will refer to nutrition to try and get individualized plan for pt.  Check labs to risk stratify.  Will follow.

## 2014-01-31 NOTE — Assessment & Plan Note (Signed)
Deteriorated.  Pt was previously using albuterol inhaler 1-2x/week.  Now using up to 5x/week.  Pt feels this may be related to excessive humidity and his recent weight gain.  Pt is using Qvar regularly- 'when I remember'.  Stressed importance of regular use.  If no improvement in pt's subjective sxs will refer to pulmonary.  Pt expressed understanding and is in agreement w/ plan.

## 2014-01-31 NOTE — Progress Notes (Signed)
Pre visit review using our clinic review tool, if applicable. No additional management support is needed unless otherwise documented below in the visit note. 

## 2014-01-31 NOTE — Patient Instructions (Signed)
Follow up in 3 months to recheck weight loss progress We'll call you with your nutrition appt AVOID NUTS! Try and make healthy food choices and get regular exercise- use something like MyFitnessPal to track your calorie intake Drink plenty of water! We'll notify you of your lab results and make any changes if needed Call with any questions or concerns Hang in there!

## 2014-01-31 NOTE — Progress Notes (Signed)
   Subjective:    Patient ID: Anthony Whitaker, male    DOB: January 23, 1966, 48 y.o.   MRN: [id number]  HPI HTN- chronic problem, well controlled on Metoprolol.  No CP.  + SOB- occuring both at rest and w/ activity.  Using Albuterol 5x/week (up from 1-2x/month)  Using Qvar daily.  No HAs, visual changes.  + edema while traveling  Possible nut allergy- pt reports 'a handful of Almonds wrecks'.  No rash.  + abd pain, 'swallow a brick'.  Obesity- chronic problem, pt continues to gain weight.  Wife if worried about possible diabetes.  Not exercising.  Has never seen nutritionist.   Review of Systems For ROS see HPI     Objective:   Physical Exam  Vitals reviewed. Constitutional: He is oriented to person, place, and time. He appears well-developed and well-nourished. No distress.  obese  HENT:  Head: Normocephalic and atraumatic.  Eyes: Conjunctivae and EOM are normal. Pupils are equal, round, and reactive to light.  Neck: Normal range of motion. Neck supple. No thyromegaly present.  Cardiovascular: Normal rate, regular rhythm, normal heart sounds and intact distal pulses.   No murmur heard. Pulmonary/Chest: Effort normal and breath sounds normal. No respiratory distress.  Abdominal: Soft. Bowel sounds are normal. He exhibits no distension.  Musculoskeletal: He exhibits no edema.  Lymphadenopathy:    He has no cervical adenopathy.  Neurological: He is alert and oriented to person, place, and time. No cranial nerve deficit.  Skin: Skin is warm and dry.  Psychiatric: He has a normal mood and affect. His behavior is normal.          Assessment & Plan:

## 2014-02-01 ENCOUNTER — Encounter: Payer: Self-pay | Admitting: General Practice

## 2014-02-04 LAB — IGG FOOD PANEL
ALLERGEN EGG WHITE IGG: 18.1 ug/mL — AB (ref <2.0)
Allergen, Milk, IgG: 9.04 ug/mL — ABNORMAL HIGH (ref <0.15)
Beef, IgG: 6.6 ug/mL — ABNORMAL HIGH (ref <2.0)
Chicken, IgG: <0.15 ug/mL (ref <0.15)
Egg yolk, IgG: 9.4 ug/mL — ABNORMAL HIGH (ref <2.0)
Peanut, IgG: <0.15 ug/mL (ref <0.15)
Wheat, IgG: <0.15 ug/mL (ref <0.15)

## 2014-02-05 ENCOUNTER — Encounter: Payer: Self-pay | Admitting: Family Medicine

## 2014-02-05 DIAGNOSIS — L659 Nonscarring hair loss, unspecified: Secondary | ICD-10-CM

## 2014-02-06 NOTE — Telephone Encounter (Signed)
Referral placed.

## 2014-03-04 ENCOUNTER — Other Ambulatory Visit: Payer: Self-pay | Admitting: Family Medicine

## 2014-03-05 NOTE — Telephone Encounter (Signed)
Med filled.  

## 2014-03-28 ENCOUNTER — Encounter: Payer: Self-pay | Admitting: Dietician

## 2014-03-28 ENCOUNTER — Encounter: Payer: BC Managed Care – PPO | Attending: Family Medicine | Admitting: Dietician

## 2014-03-28 DIAGNOSIS — Z713 Dietary counseling and surveillance: Secondary | ICD-10-CM | POA: Insufficient documentation

## 2014-03-28 DIAGNOSIS — Z6841 Body Mass Index (BMI) 40.0 and over, adult: Secondary | ICD-10-CM | POA: Insufficient documentation

## 2014-03-28 DIAGNOSIS — E669 Obesity, unspecified: Secondary | ICD-10-CM | POA: Diagnosis present

## 2014-03-28 NOTE — Patient Instructions (Addendum)
Think about going for walks at lunch and on the weekend. For breakfast, add protein (egg) to oatmeal.  Have protein with carbohydrates for snacks (apple with peanuts, cheese and crackers, or peanut and cracker/fruit). For lunch add vegetables (salad or raw vegetables). For dinner aim to fill half of your plate with vegetables and limit starch to a quarter of your plate. Think about using a smaller plate. Try to take 20 minutes to eat dinner. Put the fork down and chew 20 times per bite of food. Try to not have junk at home. (chips, ice cream, cookies).

## 2014-03-28 NOTE — Progress Notes (Signed)
  Medical Nutrition Therapy:  Appt start time: 0815 end time:  0910.   Assessment:  Primary concerns today: Anthony Whitaker is here today since he states he is "fat and getting diabetes". Hgb A1c is 6.0% which is up from 5.5% a year ago. Weight has gone up and down. 2 years ago weighed 250 lbs (gained 75 lbs) and was not actively trying to lose weight but eating habits were likely different than they are now. Was going through a divorce and was dating current wife at that time.   Since visiting doctor in July his diet and activity level has remained the same. Has a very busy job (10 hours per day) and does a lot of sitting at work. Has an 46 year old and 30 month old and cares for kids after work. Feeling exhausted at the end of the day. Exercise was taking time away from kids.   Feels that he snacks a lot which is his problem. Tries not to skip meals. Eats out 2-3 x week. Tends to eat quickly and portions are not huge but not small. Feels guilty taking time away to exercise. Stress level is high and sleeps ok now (about 7 hours) and on CPAP since June. Wife does most of the food shopping and meal preparation.  Preferred Learning Style:   No preference indicated   Learning Readiness:   Ready  MEDICATIONS: see list   DIETARY INTAKE:  Usual eating pattern includes 3 meals and 1-2 snacks per day.  Avoided foods include tree nuts, fish    24-hr recall:  B ( AM): oatmeal flavored or jimmy dean breakfast sandwich with diet soda  Snk ( AM):sometimes will have peanuts  L ( PM): [place] sandwich with bag of chips and apple  Snk ( PM): none D ( PM): baked chicken with vegetable or chicken sausage or ground [place] or pasta Snk ( PM): ice cream, chips, popcorn, cookies (junk) Beverages: diet soda, water  Usual physical activity: does not have time  Estimated energy needs: 2200 calories 248 g carbohydrates 165 g protein 61 g fat  Progress Towards Goal(s):  In progress.   Nutritional Diagnosis:   [place]-3.3 Overweight/obesity As related to hx of large portion sizes and large evening snacks.  As evidenced by BMI at 44.2.    Intervention:  Nutrition counseling provided. Plan: Think about going for walks at lunch and on the weekend. For breakfast, add protein (egg) to oatmeal.  Have protein with carbohydrates for snacks (apple with peanuts, cheese and crackers, or peanut and cracker/fruit). For lunch add vegetables (salad or raw vegetables). For dinner aim to fill half of your plate with vegetables and limit starch to a quarter of your plate. Think about using a smaller plate. Try to take 20 minutes to eat dinner. Put the fork down and chew 20 times per bite of food. Try to not have junk at home. (chips, ice cream, cookies).   Teaching Method Utilized:  Visual Auditory Hands on  Handouts given during visit include:  MyPlate Handout  Yellow Card  15 g CHO Snacks  Barriers to learning/adherence to lifestyle change: stress, busy schedule  Demonstrated degree of understanding via:  Teach Back   Monitoring/Evaluation:  Dietary intake, exercise, and body weight in 6 week(s).

## 2014-05-09 ENCOUNTER — Ambulatory Visit (INDEPENDENT_AMBULATORY_CARE_PROVIDER_SITE_OTHER): Payer: BC Managed Care – PPO | Admitting: Family Medicine

## 2014-05-09 ENCOUNTER — Encounter: Payer: Self-pay | Admitting: Family Medicine

## 2014-05-09 ENCOUNTER — Ambulatory Visit (INDEPENDENT_AMBULATORY_CARE_PROVIDER_SITE_OTHER): Payer: BC Managed Care – PPO | Admitting: General Practice

## 2014-05-09 DIAGNOSIS — F329 Major depressive disorder, single episode, unspecified: Secondary | ICD-10-CM

## 2014-05-09 DIAGNOSIS — F32A Depression, unspecified: Secondary | ICD-10-CM

## 2014-05-09 DIAGNOSIS — Z23 Encounter for immunization: Secondary | ICD-10-CM

## 2014-05-09 MED ORDER — FLUOXETINE HCL 20 MG PO TABS: 20.0000 mg | ORAL_TABLET | Freq: Every day | ORAL | Status: DC

## 2014-05-09 NOTE — Progress Notes (Signed)
Pre visit review using our clinic review tool, if applicable. No additional management support is needed unless otherwise documented below in the visit note. 

## 2014-05-09 NOTE — Patient Instructions (Signed)
Follow up in 4-6 weeks to recheck mood Start the Prozac daily Try and make healthy food choices and get regular exercise I'll be in touch about a counselor that can help Call with any questions or concerns Hang in there!  You can do this! Happy [place] OuchBirthday!

## 2014-05-09 NOTE — Progress Notes (Signed)
   Subjective:    Patient ID: Anthony Whitaker, male    DOB: 07/02/1966, 48 y.o.   MRN: [id number]  HPI Obesity- ongoing problem for pt.  He has not lost weight in last 3 months.  'it's difficult.  i'm trying'.  Pt reports that healthy foods are more difficult to prepare and carry.  States time is a barrier- 'i'm working a lot, i have the kids'.  Wife is 'just starting to pull out of the baby blues'.  Has also recently lost promotion at work.  Pt admits to possible depression.  Considering relocation, pt spends a lot of time on the computer at night job hunting.   Review of Systems For ROS see HPI     Objective:   Physical Exam  Constitutional: He is oriented to person, place, and time. He appears well-developed and well-nourished.  Morbidly obses  Neurological: He is alert and oriented to person, place, and time.  Skin: Skin is warm and dry.  Psychiatric: His behavior is normal. Thought content normal.  Flat affect.  Withdrawn.  Vitals reviewed.         Assessment & Plan:

## 2014-05-12 DIAGNOSIS — F329 Major depressive disorder, single episode, unspecified: Secondary | ICD-10-CM | POA: Insufficient documentation

## 2014-05-12 DIAGNOSIS — F32A Depression, unspecified: Secondary | ICD-10-CM | POA: Insufficient documentation

## 2014-05-12 NOTE — Assessment & Plan Note (Signed)
Pt continues to gain weight b/c he is struggling w/ depression and admits that eating is his stress outlet.  Will start meds to treat depression and refer to therapist who works extensively w/ emotional eating.  Will follow.

## 2014-05-12 NOTE — Assessment & Plan Note (Signed)
New.  Pt has been dealing w/ difficulties at work and at home.  Admits that food is his outlet.  Willing to start both meds and counseling to address this.  Start low dose prozac.  Will get names and #s of therapist who deal extensively w/ food addiction and get back to pt.  Pt expressed understanding and is in agreement w/ plan.

## 2014-05-16 ENCOUNTER — Ambulatory Visit: Payer: BC Managed Care – PPO | Admitting: Dietician

## 2014-06-03 ENCOUNTER — Other Ambulatory Visit: Payer: Self-pay | Admitting: General Practice

## 2014-06-03 DIAGNOSIS — J4520 Mild intermittent asthma, uncomplicated: Secondary | ICD-10-CM

## 2014-06-03 MED ORDER — ALBUTEROL SULFATE HFA 108 (90 BASE) MCG/ACT IN AERS: 2.0000 | INHALATION_SPRAY | RESPIRATORY_TRACT | Status: DC | PRN

## 2014-06-12 ENCOUNTER — Encounter: Payer: Self-pay | Admitting: Family Medicine

## 2014-06-12 DIAGNOSIS — F329 Major depressive disorder, single episode, unspecified: Secondary | ICD-10-CM

## 2014-06-12 DIAGNOSIS — F32A Depression, unspecified: Secondary | ICD-10-CM

## 2014-06-19 ENCOUNTER — Ambulatory Visit: Payer: BC Managed Care – PPO | Admitting: Family Medicine

## 2014-07-03 NOTE — Telephone Encounter (Signed)
Referrals placed 

## 2014-07-04 ENCOUNTER — Other Ambulatory Visit: Payer: Self-pay | Admitting: General Practice

## 2014-07-04 MED ORDER — METOPROLOL TARTRATE 25 MG PO TABS: 25.0000 mg | ORAL_TABLET | Freq: Two times a day (BID) | ORAL | Status: DC

## 2014-07-31 ENCOUNTER — Ambulatory Visit: Payer: BC Managed Care – PPO | Admitting: Family Medicine

## 2014-08-22 ENCOUNTER — Ambulatory Visit: Payer: Self-pay | Admitting: Family Medicine

## 2014-08-29 ENCOUNTER — Ambulatory Visit: Payer: Self-pay | Admitting: Family Medicine

## 2014-09-03 ENCOUNTER — Other Ambulatory Visit: Payer: Self-pay | Admitting: Family Medicine

## 2014-09-03 NOTE — Telephone Encounter (Signed)
Pt was seen in November, advised to come back in 4-6 weeks for follow up, never did.[place]. Ok to fill med?

## 2014-11-04 ENCOUNTER — Other Ambulatory Visit: Payer: Self-pay | Admitting: Family Medicine

## 2014-11-04 NOTE — Telephone Encounter (Signed)
Med filled for 30 days, letter mailed to pt to schedule a BP follow up.

## 2014-11-21 ENCOUNTER — Ambulatory Visit: Payer: BLUE CROSS/BLUE SHIELD | Admitting: Family Medicine

## 2014-11-28 ENCOUNTER — Encounter: Payer: Self-pay | Admitting: Family Medicine

## 2014-11-28 ENCOUNTER — Ambulatory Visit (INDEPENDENT_AMBULATORY_CARE_PROVIDER_SITE_OTHER): Payer: BLUE CROSS/BLUE SHIELD | Admitting: Family Medicine

## 2014-11-28 VITALS — BP 128/85 | HR 69 | Temp 98.1°F | Wt 320.8 lb

## 2014-11-28 DIAGNOSIS — I10 Essential (primary) hypertension: Secondary | ICD-10-CM | POA: Diagnosis not present

## 2014-11-28 DIAGNOSIS — J4520 Mild intermittent asthma, uncomplicated: Secondary | ICD-10-CM

## 2014-11-28 DIAGNOSIS — F32A Depression, unspecified: Secondary | ICD-10-CM

## 2014-11-28 DIAGNOSIS — F329 Major depressive disorder, single episode, unspecified: Secondary | ICD-10-CM

## 2014-11-28 MED ORDER — BUPROPION HCL ER (XL) 150 MG PO TB24: 150.0000 mg | ORAL_TABLET | Freq: Every day | ORAL | Status: DC

## 2014-11-28 NOTE — Patient Instructions (Signed)
Schedule your complete physical in 3-6 months STOP the Prozac START the Wellbutrin daily Let me know in 3-4 weeks if the symptoms are better Blood pressure looks good- continue to work on healthy diet and regular exercise If you find yourself using Albuterol more than 2-3x/week, please restart the Qvar Call with any questions or concerns Happy Memorial Day!

## 2014-11-28 NOTE — Progress Notes (Signed)
   Subjective:    Patient ID: Anthony Whitaker, male    DOB: [birth year], 49 y.o.   MRN: [id number]  HPI HTN- chronic problem, adequate control today on Metoprolol.  No CP, SOB above baseline, HAs, visual changes, edema.  Asthma- chronic problem, not using Qvar daily as directed.  Using Albuterol 1-2x/week.  Denies SOB above baseline.  Denies nocturnal sxs.  Sexual side effects- pt reports difficulty w/ both erection and ejaculation on Prozac.  sxs started shortly after initiation of medication.  Pt reports this is problematic for his relationship.   Review of Systems For ROS see HPI     Objective:   Physical Exam  Constitutional: He is oriented to person, place, and time. He appears well-developed and well-nourished. No distress.  obese  HENT:  Head: Normocephalic and atraumatic.  Eyes: Conjunctivae and EOM are normal. Pupils are equal, round, and reactive to light.  Neck: Normal range of motion. Neck supple. No thyromegaly present.  Cardiovascular: Normal rate, regular rhythm, normal heart sounds and intact distal pulses.   No murmur heard. Pulmonary/Chest: Effort normal and breath sounds normal. No respiratory distress.  Abdominal: Soft. Bowel sounds are normal. He exhibits no distension.  Musculoskeletal: He exhibits no edema.  Lymphadenopathy:    He has no cervical adenopathy.  Neurological: He is alert and oriented to person, place, and time. No cranial nerve deficit.  Skin: Skin is warm and dry.  Psychiatric: He has a normal mood and affect. His behavior is normal.  Vitals reviewed.         Assessment & Plan:

## 2014-11-28 NOTE — Progress Notes (Signed)
Pre visit review using our clinic review tool, if applicable. No additional management support is needed unless otherwise documented below in the visit note. 

## 2014-11-29 NOTE — Assessment & Plan Note (Signed)
Chronic problem.  Pt is not using Qvar daily as directed but is not requiring excessive albuterol use.  Reviewed that if he needs albuterol more than 2-3x/week, he will need to restart Qvar daily.  Pt expressed understanding and is in agreement w/ plan.

## 2014-11-29 NOTE — Assessment & Plan Note (Signed)
Chronic problem.  Adequate control.  Asymptomatic.  No med changes at this time. 

## 2014-11-29 NOTE — Assessment & Plan Note (Signed)
Chronic problem.  Pt reports sexual side effects since starting the Prozac.  Based on this, will switch to Wellbutrin to see if sxs improve.  Pt expressed understanding and is in agreement w/ plan.

## 2014-12-03 ENCOUNTER — Other Ambulatory Visit: Payer: Self-pay | Admitting: Family Medicine

## 2014-12-03 NOTE — Telephone Encounter (Signed)
Med filled.  

## 2014-12-28 ENCOUNTER — Encounter: Payer: Self-pay | Admitting: Family Medicine

## 2014-12-30 MED ORDER — BUPROPION HCL ER (XL) 300 MG PO TB24: 300.0000 mg | ORAL_TABLET | Freq: Every day | ORAL | Status: DC

## 2015-01-24 ENCOUNTER — Other Ambulatory Visit: Payer: Self-pay | Admitting: Family Medicine

## 2015-01-24 NOTE — Telephone Encounter (Signed)
Received refill request for fluoxetine. This medication was d/c and changed to Wellbutrin. Refill request denied. JG//CMA

## 2015-03-29 ENCOUNTER — Other Ambulatory Visit: Payer: Self-pay | Admitting: Family Medicine

## 2015-03-31 NOTE — Telephone Encounter (Signed)
Medication filled to pharmacy as requested.   

## 2015-04-24 ENCOUNTER — Encounter: Payer: BLUE CROSS/BLUE SHIELD | Admitting: Family Medicine

## 2015-04-24 ENCOUNTER — Encounter: Payer: Self-pay | Admitting: Family Medicine

## 2015-04-24 ENCOUNTER — Ambulatory Visit (INDEPENDENT_AMBULATORY_CARE_PROVIDER_SITE_OTHER): Payer: BLUE CROSS/BLUE SHIELD | Admitting: Family Medicine

## 2015-04-24 VITALS — BP 130/86 | HR 77 | Temp 98.0°F | Resp 17 | Ht 72.0 in | Wt 317.0 lb

## 2015-04-24 DIAGNOSIS — Z0000 Encounter for general adult medical examination without abnormal findings: Secondary | ICD-10-CM

## 2015-04-24 DIAGNOSIS — Z0100 Encounter for examination of eyes and vision without abnormal findings: Secondary | ICD-10-CM

## 2015-04-24 LAB — BASIC METABOLIC PANEL
BUN: 21 mg/dL (ref 6–23)
CO2: 29 meq/L (ref 19–32)
CREATININE: 1.05 mg/dL (ref 0.40–1.50)
Calcium: 9.2 mg/dL (ref 8.4–10.5)
Chloride: 106 mEq/L (ref 96–112)
GFR: 80.5 mL/min (ref >60.00)
Glucose, Bld: 94 mg/dL (ref 70–99)
POTASSIUM: 4.6 meq/L (ref 3.5–5.1)
Sodium: 141 mEq/L (ref 135–145)

## 2015-04-24 LAB — CBC WITH DIFFERENTIAL/PLATELET
BASOS PCT: 1.4 % (ref 0.0–3.0)
Basophils Absolute: 0.2 10*3/uL — ABNORMAL HIGH (ref 0.0–0.1)
EOS ABS: 0.4 10*3/uL (ref 0.0–0.7)
EOS PCT: 3.9 % (ref 0.0–5.0)
HEMATOCRIT: 41.2 % (ref 39.0–52.0)
HEMOGLOBIN: 13.4 g/dL (ref 13.0–17.0)
LYMPHS PCT: 14.3 % (ref 12.0–46.0)
Lymphs Abs: 1.6 10*3/uL (ref 0.7–4.0)
MCHC: 32.6 g/dL (ref 30.0–36.0)
MCV: 88.1 fl (ref 78.0–100.0)
Monocytes Absolute: 1.3 10*3/uL — ABNORMAL HIGH (ref 0.1–1.0)
Monocytes Relative: 11.6 % (ref 3.0–12.0)
Neutro Abs: 7.8 10*3/uL — ABNORMAL HIGH (ref 1.4–7.7)
Neutrophils Relative %: 68.8 % (ref 43.0–77.0)
Platelets: 287 10*3/uL (ref 150.0–400.0)
RBC: 4.68 Mil/uL (ref 4.22–5.81)
RDW: 12.9 % (ref 11.5–15.5)
WBC: 11.3 10*3/uL — AB (ref 4.0–10.5)

## 2015-04-24 LAB — HEPATIC FUNCTION PANEL
ALBUMIN: 3.8 g/dL (ref 3.5–5.2)
ALT: 20 U/L (ref 0–53)
AST: 17 U/L (ref 0–37)
Alkaline Phosphatase: 55 U/L (ref 39–117)
BILIRUBIN TOTAL: 0.3 mg/dL (ref 0.2–1.2)
Bilirubin, Direct: 0.1 mg/dL (ref 0.0–0.3)
Total Protein: 6.7 g/dL (ref 6.0–8.3)

## 2015-04-24 LAB — LIPID PANEL
Cholesterol: 150 mg/dL (ref 0–200)
HDL: 29.2 mg/dL — AB (ref >39.00)
LDL Cholesterol: 85 mg/dL (ref 0–99)
NONHDL: 120.86
Total CHOL/HDL Ratio: 5
Triglycerides: 177 mg/dL — ABNORMAL HIGH (ref 0.0–149.0)
VLDL: 35.4 mg/dL (ref 0.0–40.0)

## 2015-04-24 LAB — PSA: PSA: 1.14 ng/mL (ref 0.10–4.00)

## 2015-04-24 LAB — TSH: TSH: 2.43 u[IU]/mL (ref 0.35–4.50)

## 2015-04-24 NOTE — Assessment & Plan Note (Signed)
Pt's PE WNL w/ exception of morbid obesity.  Stressed need for healthy diet and regular exercise.  Check labs.  Anticipatory guidance provided.

## 2015-04-24 NOTE — Progress Notes (Signed)
   Subjective:    Patient ID: Anthony Whitaker, male    DOB: [birth year], 49 y.o.   MRN: [id number]  HPI CPE- no concerns today.  Pt would like referral for eye exam.   Review of Systems Patient reports no vision/hearing changes, anorexia, fever ,adenopathy, persistant/recurrent hoarseness, swallowing issues, chest pain, palpitations, edema, persistant/recurrent cough, hemoptysis, dyspnea (rest,exertional, paroxysmal nocturnal), gastrointestinal  bleeding (melena, rectal bleeding), abdominal pain, excessive heart burn, GU symptoms (dysuria, hematuria, voiding/incontinence issues) syncope, focal weakness, memory loss, numbness & tingling, skin/hair/nail changes, depression, anxiety, abnormal bruising/bleeding, musculoskeletal symptoms/signs.     Objective:   Physical Exam General Appearance:    Alert, cooperative, no distress, appears stated age, obese  Head:    Normocephalic, without obvious abnormality, atraumatic  Eyes:    PERRL, conjunctiva/corneas clear, EOM's intact, fundi    benign, both eyes       Ears:    Normal TM's and external ear canals, both ears  Nose:   Nares normal, septum midline, mucosa normal, no drainage   or sinus tenderness  Throat:   Lips, mucosa, and tongue normal; teeth and gums normal  Neck:   Supple, symmetrical, trachea midline, no adenopathy;       thyroid:  No enlargement/tenderness/nodules  Back:     Symmetric, no curvature, ROM normal, no CVA tenderness  Lungs:     Clear to auscultation bilaterally, respirations unlabored  Chest wall:    No tenderness or deformity  Heart:    Regular rate and rhythm, S1 and S2 normal, no murmur, rub   or gallop  Abdomen:     Soft, non-tender, bowel sounds active all four quadrants,    no masses, no organomegaly  Genitalia:    Normal male without lesion, masses,discharge or tenderness  Rectal:    Deferred due to young age  Extremities:   Extremities normal, atraumatic, no cyanosis or edema  Pulses:   2+ and symmetric all  extremities  Skin:   Skin color, texture, turgor normal, no rashes or lesions  Lymph nodes:   Cervical, supraclavicular, and axillary nodes normal  Neurologic:   CNII-XII intact. Normal strength, sensation and reflexes      throughout          Assessment & Plan:

## 2015-04-24 NOTE — Assessment & Plan Note (Signed)
Chronic problem.  Again stressed the need for healthy diet and regular exercise.  Will follow.

## 2015-04-24 NOTE — Progress Notes (Signed)
Pre visit review using our clinic review tool, if applicable. No additional management support is needed unless otherwise documented below in the visit note. 

## 2015-04-24 NOTE — Patient Instructions (Signed)
Follow up in 6 months to recheck BP We'll notify you of your lab results and make any changes if needed Please work on healthy diet and regular exercise Call with any questions or concerns If you want to join us at the new [place] office, any scheduled appointments will automatically transfer and we will see you at [address], [place], [place] [id number]  Happy Early Birthday!!!

## 2015-04-27 ENCOUNTER — Other Ambulatory Visit: Payer: Self-pay | Admitting: Family Medicine

## 2015-04-28 NOTE — Telephone Encounter (Signed)
Medication filled to pharmacy as requested.   

## 2015-05-20 ENCOUNTER — Other Ambulatory Visit: Payer: Self-pay | Admitting: Family Medicine

## 2015-05-20 NOTE — Telephone Encounter (Signed)
Medication filled to pharmacy as requested.   

## 2015-06-11 ENCOUNTER — Encounter: Payer: Self-pay | Admitting: Family Medicine

## 2015-06-27 ENCOUNTER — Ambulatory Visit (INDEPENDENT_AMBULATORY_CARE_PROVIDER_SITE_OTHER): Payer: BLUE CROSS/BLUE SHIELD | Admitting: Psychology

## 2015-06-27 DIAGNOSIS — F4323 Adjustment disorder with mixed anxiety and depressed mood: Secondary | ICD-10-CM

## 2015-07-09 ENCOUNTER — Ambulatory Visit (INDEPENDENT_AMBULATORY_CARE_PROVIDER_SITE_OTHER): Payer: BLUE CROSS/BLUE SHIELD | Admitting: Psychology

## 2015-07-09 ENCOUNTER — Ambulatory Visit: Payer: BLUE CROSS/BLUE SHIELD | Admitting: Psychology

## 2015-07-09 DIAGNOSIS — F4323 Adjustment disorder with mixed anxiety and depressed mood: Secondary | ICD-10-CM | POA: Diagnosis not present

## 2015-07-23 ENCOUNTER — Ambulatory Visit (INDEPENDENT_AMBULATORY_CARE_PROVIDER_SITE_OTHER): Payer: BLUE CROSS/BLUE SHIELD | Admitting: Psychology

## 2015-07-23 DIAGNOSIS — F4323 Adjustment disorder with mixed anxiety and depressed mood: Secondary | ICD-10-CM

## 2015-07-27 ENCOUNTER — Other Ambulatory Visit: Payer: Self-pay | Admitting: Family Medicine

## 2015-07-28 NOTE — Telephone Encounter (Signed)
Medication filled to pharmacy as requested.   

## 2015-08-06 ENCOUNTER — Ambulatory Visit (INDEPENDENT_AMBULATORY_CARE_PROVIDER_SITE_OTHER): Payer: BLUE CROSS/BLUE SHIELD | Admitting: Psychology

## 2015-08-06 DIAGNOSIS — F4323 Adjustment disorder with mixed anxiety and depressed mood: Secondary | ICD-10-CM

## 2015-08-20 ENCOUNTER — Ambulatory Visit (INDEPENDENT_AMBULATORY_CARE_PROVIDER_SITE_OTHER): Payer: BLUE CROSS/BLUE SHIELD | Admitting: Psychology

## 2015-08-20 DIAGNOSIS — F4323 Adjustment disorder with mixed anxiety and depressed mood: Secondary | ICD-10-CM

## 2015-09-03 ENCOUNTER — Ambulatory Visit (INDEPENDENT_AMBULATORY_CARE_PROVIDER_SITE_OTHER): Payer: BLUE CROSS/BLUE SHIELD | Admitting: Psychology

## 2015-09-03 DIAGNOSIS — F411 Generalized anxiety disorder: Secondary | ICD-10-CM

## 2015-09-17 ENCOUNTER — Ambulatory Visit (INDEPENDENT_AMBULATORY_CARE_PROVIDER_SITE_OTHER): Payer: BLUE CROSS/BLUE SHIELD | Admitting: Psychology

## 2015-09-17 DIAGNOSIS — F4323 Adjustment disorder with mixed anxiety and depressed mood: Secondary | ICD-10-CM | POA: Diagnosis not present

## 2015-09-18 ENCOUNTER — Telehealth: Payer: Self-pay | Admitting: Family Medicine

## 2015-09-18 NOTE — Telephone Encounter (Signed)
Caller name: Theadora RamaCherry Willis, [employer] Case Manager Can be reached: [phone] ext [phone]  Reason for call: Anthony Whitaker called pts home to discuss issues relative to his insurance benefits and she states a man answered the phone and told her the pt was dead. I told her we scheduled an appt yesterday for the pt to change PCP. I advised her I would contact the pt and notify her of my findings. Called pt and left msg for him to contact our office. I did not mention the call above. We just need to verify pt info and notify [employer] if pt is alive so his insurance coverage is not terminated or have the pt contact [employer].

## 2015-09-18 NOTE — Telephone Encounter (Signed)
Called and LMOVM with emergency Contact Megan jones to inform that we are trying to reach patient.

## 2015-09-19 ENCOUNTER — Encounter: Payer: Self-pay | Admitting: General Practice

## 2015-09-19 NOTE — Telephone Encounter (Signed)
mychart message was sent to he patient this morning as well.

## 2015-09-19 NOTE — Telephone Encounter (Signed)
Called and spoke with pt in person, gave him the contact information for [employer] and he is calling now.

## 2015-10-01 ENCOUNTER — Ambulatory Visit (INDEPENDENT_AMBULATORY_CARE_PROVIDER_SITE_OTHER): Payer: BLUE CROSS/BLUE SHIELD | Admitting: Psychology

## 2015-10-01 DIAGNOSIS — F411 Generalized anxiety disorder: Secondary | ICD-10-CM

## 2015-10-23 ENCOUNTER — Ambulatory Visit: Payer: BLUE CROSS/BLUE SHIELD | Admitting: Family Medicine

## 2015-10-28 ENCOUNTER — Other Ambulatory Visit: Payer: Self-pay | Admitting: General Practice

## 2015-10-28 MED ORDER — METOPROLOL TARTRATE 25 MG PO TABS: 25.0000 mg | ORAL_TABLET | Freq: Two times a day (BID) | ORAL | Status: DC

## 2015-10-29 ENCOUNTER — Encounter: Payer: Self-pay | Admitting: *Deleted

## 2015-10-29 ENCOUNTER — Telehealth: Payer: Self-pay | Admitting: *Deleted

## 2015-10-29 NOTE — Telephone Encounter (Signed)
Pre-Visit Call completed with patient and chart updated.   Pre-Visit Info documented in Specialty Comments under [employer].    

## 2015-10-30 ENCOUNTER — Ambulatory Visit: Payer: BLUE CROSS/BLUE SHIELD | Admitting: Family Medicine

## 2015-11-06 ENCOUNTER — Ambulatory Visit (INDEPENDENT_AMBULATORY_CARE_PROVIDER_SITE_OTHER): Payer: BLUE CROSS/BLUE SHIELD | Admitting: Family Medicine

## 2015-11-06 ENCOUNTER — Encounter: Payer: Self-pay | Admitting: Family Medicine

## 2015-11-06 VITALS — BP 150/100 | HR 70 | Temp 98.2°F | Ht 72.0 in | Wt 324.4 lb

## 2015-11-06 DIAGNOSIS — I10 Essential (primary) hypertension: Secondary | ICD-10-CM | POA: Diagnosis not present

## 2015-11-06 DIAGNOSIS — E669 Obesity, unspecified: Secondary | ICD-10-CM

## 2015-11-06 DIAGNOSIS — J4520 Mild intermittent asthma, uncomplicated: Secondary | ICD-10-CM

## 2015-11-06 MED ORDER — BECLOMETHASONE DIPROPIONATE 80 MCG/ACT IN AERS: 1.0000 | INHALATION_SPRAY | Freq: Two times a day (BID) | RESPIRATORY_TRACT | Status: AC

## 2015-11-06 MED ORDER — MONTELUKAST SODIUM 10 MG PO TABS: 10.0000 mg | ORAL_TABLET | Freq: Every day | ORAL | Status: AC

## 2015-11-06 NOTE — Progress Notes (Signed)
[facility] [address], [address] [place], [place] [phone]  Date:  11/06/2015   Name:  Anthony Whitaker   DOB:  27-Jan-1966   MRN:  [id number]  PCP:  Neena Rhymes, MD    Chief Complaint: Follow-up   History of Present Illness:  Anthony Whitaker is a 50 y.o. very pleasant male patient who presents with the following:  History of HTN.  Here today for a periodic recheck.   He has been feeling well.   He has not noted any unusual headaches.  He did take sudafed this am- he does not take this a generally but was having bad allergy sx He does not generally check his BP at home but he does have a cuff- can start home monitoring He has been stable on his dose of BP medication for several years He has gained some weight and knows that he should try and lose BP Readings from Last 3 Encounters:  11/06/15 149/101  04/24/15 130/86  11/28/14 128/85   Wt Readings from Last 3 Encounters:  11/06/15 324 lb 6.4 oz (147.147 kg)  04/24/15 317 lb (143.79 kg)  11/28/14 320 lb 12.8 oz (145.514 kg)      Patient Active Problem List   Diagnosis Date Noted  . Depression 05/12/2014  . Nut allergy 01/31/2014  . Routine general medical examination at a health care facility 08/03/2013  . Severe obesity (BMI >= 40) (HCC) 08/03/2013  . OSA (obstructive sleep apnea) 08/03/2013  . HTN (hypertension) 02/01/2013  . Asthma, mild intermittent, well-controlled 02/01/2013  . Hearing loss of both ears 02/01/2013  . 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 Asthma   . Hypertension   . Frequent headaches   . Allergy   . GERD (gastroesophageal reflux disease)   . Migraines     Past Surgical History  Procedure Laterality Date  . Apnea /septum correction      Social History  Substance Use Topics  . Smoking status: Never Smoker   . Smokeless tobacco: None  . Alcohol Use: Yes     Comment:  weekly    Family History  Problem Relation Age of Onset  . Arthritis Mother   . Hyperlipidemia Mother   . Heart disease Father   . Arthritis Paternal Grandmother   . Hyperlipidemia Paternal Grandmother   . Heart disease Paternal Grandmother   . Stroke Paternal Grandmother   . Hypertension Paternal Grandmother     Allergies  Allergen Reactions  . Fish Allergy Swelling    Medication list has been reviewed and updated.  Current Outpatient Prescriptions on File Prior to Visit  Medication Sig Dispense Refill  . beclomethasone (QVAR) 80 MCG/ACT inhaler Inhale 2 puffs into the lungs as needed. 1 Inhaler 11  . buPROPion (WELLBUTRIN XL) 300 MG 24 hr tablet TAKE 1 TABLET BY MOUTH DAILY. 30 tablet 6  . metoprolol tartrate (LOPRESSOR) 25 MG tablet Take 1 tablet (25 mg total) by mouth 2 (two) times daily. 60 tablet 0  . VENTOLIN HFA 108 (90 BASE) MCG/ACT inhaler INHALE 2 PUFFS EVERY 4 HOURS AS NEEDED FOR WHEEZING & COUGH & SHORTNESS OF BREATH 18 Inhaler 3   No current facility-administered medications on file prior to visit.    Review of Systems: Notes that he needs his rescue inhaler every other day on average right now As per HPI- otherwise negative.  Physical Examination: Filed Vitals:   11/06/15 1549  BP: 149/101  Pulse: 70  Temp: 98.2 F (36.8 C)   Filed Vitals:   11/06/15 1549  Height: 6' (1.829 m)  Weight: 324 lb 6.4 oz (147.147 kg)   Body mass index is 43.99 kg/(m^2). Ideal Body Weight: Weight in (lb) to have BMI = 25: 183.9  GEN: WDWN, NAD, Non-toxic, A & O x 3, obese, looks well HEENT: Atraumatic, Normocephalic. Neck supple. No masses, No LAD. Ears and Nose: No external deformity. CV: RRR, No M/G/R. No JVD. No thrill. No extra heart sounds. PULM: CTA B, no wheezes, crackles, rhonchi. No retractions. No resp. distress. No accessory muscle use. ABD: S, NT, ND, +BS. No rebound. No HSM. EXTR: No c/c/e NEURO Normal gait.  PSYCH: Normally interactive. Conversant.  Not depressed or anxious appearing.  Calm demeanor.    Assessment and Plan: Asthma, mild intermittent, uncomplicated - Plan: montelukast (SINGULAIR) 10 MG tablet, beclomethasone (QVAR) 80 MCG/ACT inhaler  Essential hypertension  Obesity  BP is higher today but he did take sudafed.  He will monitor at home and let me know if it continues to run high.  He is having significant asthma and allergy sx Add qvar and singulair at least through his allergy season.  Encouraged weight loss and he will try  Recheck in 4 months- See patient instructions for more details.      Signed Abbe AmsterdamJessica Copland, MD

## 2015-11-06 NOTE — Patient Instructions (Signed)
Your blood pressure is higher than normal today-this is probably from using sudafed. It is ok to use this medication on occasion but I would prefer to use other things for your asthma and allergies Start back on the qvar inhaler- 1 puff twice a day regardless of symptoms until you are through your allergy season You can also take singulair once a day for asthma and allergies Please check your BP at home a few time over the next few weeks and let me know if you continue to run higher than 140/85.    I would also recommend that you work on weight loss- this will help to control your blood pressure naturally.  It will also help to improve your cholesterol  In any case please see me in about 4 months to check on your progress and labs

## 2015-11-06 NOTE — Progress Notes (Signed)
Pre visit review using our clinic review tool, if applicable. No additional management support is needed unless otherwise documented below in the visit note. 

## 2015-11-28 ENCOUNTER — Other Ambulatory Visit: Payer: Self-pay | Admitting: General Practice

## 2015-11-28 MED ORDER — METOPROLOL TARTRATE 25 MG PO TABS: 25.0000 mg | ORAL_TABLET | Freq: Two times a day (BID) | ORAL | Status: DC

## 2015-11-28 MED ORDER — BUPROPION HCL ER (XL) 300 MG PO TB24: 300.0000 mg | ORAL_TABLET | Freq: Every day | ORAL | Status: DC

## 2015-12-26 ENCOUNTER — Other Ambulatory Visit: Payer: Self-pay | Admitting: General Practice

## 2015-12-26 NOTE — Telephone Encounter (Signed)
Per chart pt was seen by Dr. Patsy Lageropland for Bp follow up and PCP was changed in the chart. Refills sent for approval.

## 2015-12-27 MED ORDER — BUPROPION HCL ER (XL) 300 MG PO TB24: 300.0000 mg | ORAL_TABLET | Freq: Every day | ORAL | Status: DC

## 2015-12-27 MED ORDER — METOPROLOL TARTRATE 25 MG PO TABS: 25.0000 mg | ORAL_TABLET | Freq: Two times a day (BID) | ORAL | Status: DC

## 2016-08-22 ENCOUNTER — Encounter: Payer: Self-pay | Admitting: Family Medicine

## 2016-08-23 MED ORDER — ALBUTEROL SULFATE HFA 108 (90 BASE) MCG/ACT IN AERS: 2.0000 | INHALATION_SPRAY | RESPIRATORY_TRACT | 0 refills | Status: DC | PRN

## 2016-11-13 ENCOUNTER — Emergency Department (HOSPITAL_BASED_OUTPATIENT_CLINIC_OR_DEPARTMENT_OTHER)
Admission: EM | Admit: 2016-11-13 | Discharge: 2016-11-13 | Disposition: A | Discharge status: Home / Self Care | Payer: BLUE CROSS/BLUE SHIELD | Attending: Emergency Medicine | Admitting: Emergency Medicine

## 2016-11-13 ENCOUNTER — Encounter (HOSPITAL_BASED_OUTPATIENT_CLINIC_OR_DEPARTMENT_OTHER): Payer: Self-pay | Admitting: *Deleted

## 2016-11-13 ENCOUNTER — Emergency Department (HOSPITAL_BASED_OUTPATIENT_CLINIC_OR_DEPARTMENT_OTHER): Payer: BLUE CROSS/BLUE SHIELD

## 2016-11-13 DIAGNOSIS — J45909 Unspecified asthma, uncomplicated: Secondary | ICD-10-CM | POA: Diagnosis not present

## 2016-11-13 DIAGNOSIS — R1030 Lower abdominal pain, unspecified: Secondary | ICD-10-CM | POA: Diagnosis present

## 2016-11-13 DIAGNOSIS — R599 Enlarged lymph nodes, unspecified: Secondary | ICD-10-CM | POA: Insufficient documentation

## 2016-11-13 DIAGNOSIS — R197 Diarrhea, unspecified: Secondary | ICD-10-CM

## 2016-11-13 DIAGNOSIS — Z79899 Other long term (current) drug therapy: Secondary | ICD-10-CM | POA: Diagnosis not present

## 2016-11-13 DIAGNOSIS — Z87891 Personal history of nicotine dependence: Secondary | ICD-10-CM | POA: Diagnosis not present

## 2016-11-13 DIAGNOSIS — K529 Noninfective gastroenteritis and colitis, unspecified: Secondary | ICD-10-CM | POA: Diagnosis not present

## 2016-11-13 DIAGNOSIS — I10 Essential (primary) hypertension: Secondary | ICD-10-CM | POA: Diagnosis not present

## 2016-11-13 DIAGNOSIS — R591 Generalized enlarged lymph nodes: Secondary | ICD-10-CM

## 2016-11-13 LAB — CBC WITH DIFFERENTIAL/PLATELET
BASOS PCT: 0 %
Basophils Absolute: 0 10*3/uL (ref 0.0–0.1)
Eosinophils Absolute: 0.5 10*3/uL (ref 0.0–0.7)
Eosinophils Relative: 9 %
HCT: 45.8 % (ref 39.0–52.0)
HEMOGLOBIN: 15.8 g/dL (ref 13.0–17.0)
LYMPHS ABS: 1 10*3/uL (ref 0.7–4.0)
Lymphocytes Relative: 17 %
MCH: 30.7 pg (ref 26.0–34.0)
MCHC: 34.5 g/dL (ref 30.0–36.0)
MCV: 88.9 fL (ref 78.0–100.0)
MONOS PCT: 18 %
Monocytes Absolute: 1.1 10*3/uL — ABNORMAL HIGH (ref 0.1–1.0)
NEUTROS ABS: 3.3 10*3/uL (ref 1.7–7.7)
NEUTROS PCT: 56 %
Platelets: 227 10*3/uL (ref 150–400)
RBC: 5.15 MIL/uL (ref 4.22–5.81)
RDW: 13.7 % (ref 11.5–15.5)
WBC: 6 10*3/uL (ref 4.0–10.5)

## 2016-11-13 LAB — URINALYSIS, ROUTINE W REFLEX MICROSCOPIC
GLUCOSE, UA: NEGATIVE mg/dL
KETONES UR: 15 mg/dL — AB
LEUKOCYTES UA: NEGATIVE
NITRITE: NEGATIVE
PH: 6 (ref 5.0–8.0)
Protein, ur: 30 mg/dL — AB
Specific Gravity, Urine: 1.028 (ref 1.005–1.030)

## 2016-11-13 LAB — URINALYSIS, MICROSCOPIC (REFLEX): RBC / HPF: NONE SEEN RBC/hpf (ref 0–5)

## 2016-11-13 LAB — COMPREHENSIVE METABOLIC PANEL
ALBUMIN: 4.3 g/dL (ref 3.5–5.0)
ALK PHOS: 57 U/L (ref 38–126)
ALT: 35 U/L (ref 17–63)
ANION GAP: 10 (ref 5–15)
AST: 31 U/L (ref 15–41)
BILIRUBIN TOTAL: 0.4 mg/dL (ref 0.3–1.2)
BUN: 25 mg/dL — AB (ref 6–20)
CALCIUM: 9.5 mg/dL (ref 8.9–10.3)
CO2: 22 mmol/L (ref 22–32)
CREATININE: 1.33 mg/dL — AB (ref 0.61–1.24)
Chloride: 109 mmol/L (ref 101–111)
GFR calc Af Amer: >60 mL/min (ref >60)
GFR calc non Af Amer: >60 mL/min (ref >60)
GLUCOSE: 104 mg/dL — AB (ref 65–99)
Potassium: 4.2 mmol/L (ref 3.5–5.1)
Sodium: 141 mmol/L (ref 135–145)
TOTAL PROTEIN: 7.9 g/dL (ref 6.5–8.1)

## 2016-11-13 LAB — LIPASE, BLOOD: Lipase: 22 U/L (ref 11–51)

## 2016-11-13 MED ORDER — SODIUM CHLORIDE 0.9 % IV BOLUS (SEPSIS)
1000.0000 mL | Freq: Once | INTRAVENOUS | Status: AC
  Administered 2016-11-13: 1000 mL via INTRAVENOUS

## 2016-11-13 MED ORDER — ONDANSETRON 4 MG PO TBDP: 4.0000 mg | ORAL_TABLET | Freq: Three times a day (TID) | ORAL | 0 refills | Status: AC | PRN

## 2016-11-13 MED ORDER — DICYCLOMINE HCL 10 MG/ML IM SOLN
20.0000 mg | Freq: Once | INTRAMUSCULAR | Status: DC
  Filled 2016-11-13: qty 2

## 2016-11-13 MED ORDER — IOPAMIDOL (ISOVUE-300) INJECTION 61%
100.0000 mL | Freq: Once | INTRAVENOUS | Status: AC | PRN
  Administered 2016-11-13: 100 mL via INTRAVENOUS

## 2016-11-13 MED ORDER — DICYCLOMINE HCL 20 MG PO TABS: 20.0000 mg | ORAL_TABLET | Freq: Two times a day (BID) | ORAL | 0 refills | Status: AC

## 2016-11-13 MED ORDER — ONDANSETRON HCL 4 MG/2ML IJ SOLN
4.0000 mg | Freq: Once | INTRAMUSCULAR | Status: AC
  Administered 2016-11-13: 4 mg via INTRAVENOUS
  Filled 2016-11-13: qty 2

## 2016-11-13 NOTE — ED Notes (Signed)
Patient denies pain and is resting comfortably.  

## 2016-11-13 NOTE — ED Triage Notes (Signed)
Patient states three days ago he developed lower abdominal pain, nausea and diarrhea.  States the diarrhea has persisted and now his abdominal pain is more soreness.  Describes the diarrhea as watery, milky white to green in color.  States he had chills and sweats but no documented fever.

## 2016-11-13 NOTE — ED Notes (Signed)
Pt refused wheelchair. Denys dizziness.

## 2016-11-13 NOTE — ED Notes (Signed)
Pt requests to have a stool sample sent to lab. MD and RN made aware.

## 2016-11-13 NOTE — ED Notes (Signed)
Patient transported to CT 

## 2016-11-13 NOTE — ED Notes (Signed)
approx 15 loose stools since 1am

## 2016-11-13 NOTE — ED Notes (Signed)
Pt given d/c instructions as per chart. Rx x 2. Note for work. Verbalizes understanding. No questions.

## 2016-11-13 NOTE — ED Provider Notes (Signed)
[facility]   CSN: [id number] Arrival date & time: 11/13/16  0954     History   Chief Complaint Chief Complaint  Patient presents with  . Abdominal Pain  . Diarrhea    HPI Anthony Whitaker is a 51 y.o. male.  HPI   Wednesday started having nausea, diarrhea, abdominal pain. Aching soreness.  Reports the pain is located in the lower abdomen, at this time is a 2 out of 10. Reports many episodes of diarrhea a day, 15 since 1AM, described as watery, green in color. Reports feeling hot and cold, but no known fever. Reports he's felt severe nausea, but is unable to vomit. No known history of diverticulitis. No history of recent antibiotic use, no known sick contacts. Eating a lot of leafy greens  Past Medical History:  Diagnosis Date  . Allergy   . Asthma   . Frequent headaches   . GERD (gastroesophageal reflux disease)   . Hypertension   . Migraines     Patient Active Problem List   Diagnosis Date Noted  . Depression 05/12/2014  . Nut allergy 01/31/2014  . Routine general medical examination at a health care facility 08/03/2013  . Severe obesity (BMI >= 40) (HCC) 08/03/2013  . OSA (obstructive sleep apnea) 08/03/2013  . HTN (hypertension) 02/01/2013  . Asthma, mild intermittent, well-controlled 02/01/2013  . Hearing loss of both ears 02/01/2013  . Arthropathy of cervical spine (HCC) 02/01/2013    Past Surgical History:  Procedure Laterality Date  . apnea /septum correction     nasal septum correction.  Marland Kitchen UVULOPALATOPHARYNGOPLASTY     uses Cpap at night        Home Medications    Prior to Admission medications   Medication Sig Start Date End Date Taking? Authorizing Provider  albuterol (VENTOLIN HFA) 108 (90 Base) MCG/ACT inhaler Inhale 2 puffs into the lungs every 4 (four) hours as needed for wheezing or shortness of breath. 08/23/16  Yes Copland, Gwenlyn Found, MD  buPROPion (WELLBUTRIN XL) 300 MG 24 hr tablet Take 1 tablet (300 mg total) by  mouth daily. 12/27/15  Yes Copland, Gwenlyn Found, MD  cetirizine (ZYRTEC) 10 MG tablet Take 10 mg by mouth daily.   Yes [provider]  metoprolol tartrate (LOPRESSOR) 25 MG tablet Take 1 tablet (25 mg total) by mouth 2 (two) times daily. 12/27/15  Yes Copland, Gwenlyn Found, MD  beclomethasone (QVAR) 80 MCG/ACT inhaler Inhale 1 puff into the lungs 2 (two) times daily. 11/06/15   Copland, Gwenlyn Found, MD  dicyclomine (BENTYL) 20 MG tablet Take 1 tablet (20 mg total) by mouth 2 (two) times daily. 11/13/16   Alvira Monday, MD  montelukast (SINGULAIR) 10 MG tablet Take 1 tablet (10 mg total) by mouth at bedtime. 11/06/15   Copland, Gwenlyn Found, MD  ondansetron (ZOFRAN ODT) 4 MG disintegrating tablet Take 1 tablet (4 mg total) by mouth every 8 (eight) hours as needed for nausea or vomiting. 11/13/16   Alvira Monday, MD    Family History Family History  Problem Relation Age of Onset  . Arthritis Mother   . Hyperlipidemia Mother   . Heart disease Father   . Arthritis Paternal Grandmother   . Hyperlipidemia Paternal Grandmother   . Heart disease Paternal Grandmother   . Stroke Paternal Grandmother   . Hypertension Paternal Grandmother     Social History Social History  Substance Use Topics  . Smoking status: Never Smoker  . Smokeless tobacco: Former Neurosurgeon  .  Alcohol use Yes     Comment: weekly     Allergies   Fish allergy   Review of Systems Review of Systems  Constitutional: Positive for fatigue. Negative for fever.  HENT: Negative for sore throat.   Eyes: Negative for visual disturbance.  Respiratory: Negative for shortness of breath.   Cardiovascular: Negative for chest pain.  Gastrointestinal: Positive for abdominal pain, diarrhea and nausea. Negative for constipation and vomiting.  Genitourinary: Negative for difficulty urinating.  Musculoskeletal: Negative for back pain and neck stiffness.  Skin: Negative for rash.  Neurological: Negative for syncope and headaches.      Physical Exam Updated Vital Signs BP 114/68 (BP Location: Right Arm)   Pulse 86   Temp 99 F (37.2 C) (Oral)   Resp 20   Ht 5\' 11"  (1.803 m)   Wt 289 lb 14.4 oz (131.5 kg)   SpO2 97%   BMI 40.43 kg/m   Physical Exam  Constitutional: He is oriented to person, place, and time. He appears well-developed and well-nourished. No distress.  HENT:  Head: Normocephalic and atraumatic.  Eyes: Conjunctivae and EOM are normal.  Neck: Normal range of motion.  Cardiovascular: Normal rate, regular rhythm, normal heart sounds and intact distal pulses.  Exam reveals no gallop and no friction rub.   No murmur heard. Pulmonary/Chest: Effort normal and breath sounds normal. No respiratory distress. He has no wheezes. He has no rales.  Abdominal: Soft. He exhibits no distension. There is tenderness in the left upper quadrant and left lower quadrant. There is no guarding.  Musculoskeletal: He exhibits no edema.  Neurological: He is alert and oriented to person, place, and time.  Skin: Skin is warm and dry. He is not diaphoretic.  Nursing note and vitals reviewed.    ED Treatments / Results  Labs (all labs ordered are listed, but only abnormal results are displayed) Labs Reviewed  CBC WITH DIFFERENTIAL/PLATELET - Abnormal; Notable for the following:       Result Value   Monocytes Absolute 1.1 (*)    All other components within normal limits  COMPREHENSIVE METABOLIC PANEL - Abnormal; Notable for the following:    Glucose, Bld 104 (*)    BUN 25 (*)    Creatinine, Ser 1.33 (*)    All other components within normal limits  URINALYSIS, ROUTINE W REFLEX MICROSCOPIC - Abnormal; Notable for the following:    APPearance CLOUDY (*)    Hgb urine dipstick TRACE (*)    Bilirubin Urine MODERATE (*)    Ketones, ur 15 (*)    Protein, ur 30 (*)    All other components within normal limits  URINALYSIS, MICROSCOPIC (REFLEX) - Abnormal; Notable for the following:    Bacteria, UA MANY (*)    Squamous  Epithelial / LPF 0-5 (*)    All other components within normal limits  URINE CULTURE  LIPASE, BLOOD    EKG  EKG Interpretation None       Radiology Ct Abdomen Pelvis W Contrast  Result Date: 11/13/2016 CLINICAL DATA:  Left lower quadrant pain.  Diarrhea. EXAM: CT ABDOMEN AND PELVIS WITH CONTRAST TECHNIQUE: Multidetector CT imaging of the abdomen and pelvis was performed using the standard protocol following bolus administration of intravenous contrast. CONTRAST:  ISOVUE-300 IOPAMIDOL (ISOVUE-300) INJECTION 61% COMPARISON:  None. FINDINGS: Lower chest: No acute abnormality. Hepatobiliary: No focal liver abnormality is seen. No gallstones, gallbladder wall thickening, or biliary dilatation. Pancreas: Unremarkable. No pancreatic ductal dilatation or surrounding inflammatory changes. Spleen: Normal  in size without focal abnormality. Adrenals/Urinary Tract: Adrenal glands are unremarkable. Kidneys are normal, without renal calculi, focal lesion, or hydronephrosis. Bladder is unremarkable. Stomach/Bowel: The stomach and small bowel are normal. The: And appendix are normal. Vascular/Lymphatic: Haziness in the mesenteric fat with numerous mesenteric lymph nodes. A representative node in the mesenteries on series 5, image 44 measures 13 mm in short axis. Other scattered nodes throughout the abdomen, most normal by CT criteria. There is a 12 mm node in the right side of the abdomen on series 2, image 61 which is mildly enlarged. Reproductive: Prostate is unremarkable. Other: No abdominal wall hernia or abnormality. No abdominopelvic ascites. Musculoskeletal: No acute or significant osseous findings. IMPRESSION: 1. No acute abnormality identified to explain left lower quadrant pain. 2. Increased attenuation in the mesenteric fat with numerous prominent and mildly enlarged nodes. Other shotty nodes are identified in the abdomen with a mildly enlarged node in the posterior right abdomen as described above.  This finding is nonspecific and could be reactive. However, a 3 to 6 month follow-up CT scan is recommended for further evaluation. Electronically Signed   By: Gerome Samavid  Williams III M.D   On: 11/13/2016 14:13    Procedures Procedures (including critical care time)  Medications Ordered in ED Medications  dicyclomine (BENTYL) injection 20 mg (20 mg Intramuscular Refused 11/13/16 1221)  sodium chloride 0.9 % bolus 1,000 mL (0 mLs Intravenous Stopped 11/13/16 1156)  sodium chloride 0.9 % bolus 1,000 mL (0 mLs Intravenous Stopped 11/13/16 1352)  ondansetron (ZOFRAN) injection 4 mg (4 mg Intravenous Given 11/13/16 1222)  iopamidol (ISOVUE-300) 61 % injection 100 mL (100 mLs Intravenous Contrast Given 11/13/16 1333)     Initial Impression / Assessment and Plan / ED Course  I have reviewed the triage vital signs and the nursing notes.  Pertinent labs & imaging results that were available during my care of the patient were reviewed by me and considered in my medical decision making (see chart for details).     51 year-old male with history of hypertension presents with concern for nausea and diarrhea since Wednesday. Patient also reports abdominal pain, and has left lower quadrant tenderness on exam. CT abdomen and pelvis was ordered to evaluate for signs of diverticulitis. CT shows no sign of diverticulitis, however does show intra-abdominal lymphadenopath. Discussed with this patient and recommend follow-up CT in 3-6 months. Labs showed no significant electrolyte abnormalities, no leukocytosis. Urinalysis shows bacteria, suspect contamination.  Patient unable to obtain stool sample however, do not feel stool studies will change care at this time.  Suspect viral versus bacterial gastroenteritis, and recommend supportive care for either condition, continued hydration, PCP follow up if diarrhea continues.  Patient given a prescription for Zofran and Bentyl. Patient discharged in stable condition with  understanding of reasons to return.   Final Clinical Impressions(s) / ED Diagnoses   Final diagnoses:  Gastroenteritis  Lymphadenopathy, likely reactive, follow up with repeat CT in 3-6 months  Diarrhea of presumed infectious origin    New Prescriptions Discharge Medication List as of 11/13/2016  3:16 PM    START taking these medications   Details  dicyclomine (BENTYL) 20 MG tablet Take 1 tablet (20 mg total) by mouth 2 (two) times daily., Starting Sat 11/13/2016, Print    ondansetron (ZOFRAN ODT) 4 MG disintegrating tablet Take 1 tablet (4 mg total) by mouth every 8 (eight) hours as needed for nausea or vomiting., Starting Sat 11/13/2016, Print  Alvira Monday, MD 11/13/16 1730

## 2016-11-14 LAB — URINE CULTURE: CULTURE: NO GROWTH

## 2016-11-15 ENCOUNTER — Telehealth: Payer: Self-pay

## 2016-11-15 NOTE — Telephone Encounter (Signed)
11/15/16  Called patient to follow up with [facility] call he placed on 11/13/16. Patient went to ED this day also. DX: Gastroenteritis Left message for patient to return call to scheduled follow up appointment with provider.

## 2016-11-15 NOTE — Telephone Encounter (Signed)
Follow up call made to patient regarding his call to [facility] and ED visit on Nov 13, 2016. Advised patient follow up call was being made to see how he was feeling and to schedule follow up appointment. Patient states he feels better and he does not need a follow up appointment at this time. Advised to call office or go to Ed if symptoms return. Patient agreed.

## 2016-12-23 ENCOUNTER — Encounter: Payer: Self-pay | Admitting: Family Medicine

## 2016-12-23 DIAGNOSIS — R7303 Prediabetes: Secondary | ICD-10-CM

## 2016-12-23 DIAGNOSIS — E785 Hyperlipidemia, unspecified: Secondary | ICD-10-CM

## 2016-12-23 DIAGNOSIS — Z125 Encounter for screening for malignant neoplasm of prostate: Secondary | ICD-10-CM

## 2016-12-23 DIAGNOSIS — Z130 Encounter for screening for diseases of the blood and blood-forming organs and certain disorders involving the immune mechanism: Secondary | ICD-10-CM

## 2016-12-31 ENCOUNTER — Other Ambulatory Visit (INDEPENDENT_AMBULATORY_CARE_PROVIDER_SITE_OTHER): Payer: BLUE CROSS/BLUE SHIELD

## 2016-12-31 DIAGNOSIS — E785 Hyperlipidemia, unspecified: Secondary | ICD-10-CM

## 2016-12-31 DIAGNOSIS — Z130 Encounter for screening for diseases of the blood and blood-forming organs and certain disorders involving the immune mechanism: Secondary | ICD-10-CM | POA: Diagnosis not present

## 2016-12-31 DIAGNOSIS — R7303 Prediabetes: Secondary | ICD-10-CM

## 2016-12-31 DIAGNOSIS — Z125 Encounter for screening for malignant neoplasm of prostate: Secondary | ICD-10-CM

## 2016-12-31 LAB — LIPID PANEL
CHOLESTEROL: 176 mg/dL (ref 0–200)
HDL: 25.6 mg/dL — ABNORMAL LOW (ref >39.00)
LDL Cholesterol: 116 mg/dL — ABNORMAL HIGH (ref 0–99)
NonHDL: 150.85
TRIGLYCERIDES: 176 mg/dL — AB (ref 0.0–149.0)
Total CHOL/HDL Ratio: 7
VLDL: 35.2 mg/dL (ref 0.0–40.0)

## 2016-12-31 LAB — COMPREHENSIVE METABOLIC PANEL
ALBUMIN: 4.1 g/dL (ref 3.5–5.2)
ALT: 25 U/L (ref 0–53)
AST: 19 U/L (ref 0–37)
Alkaline Phosphatase: 50 U/L (ref 39–117)
BILIRUBIN TOTAL: 0.3 mg/dL (ref 0.2–1.2)
BUN: 20 mg/dL (ref 6–23)
CO2: 24 mEq/L (ref 19–32)
CREATININE: 1.15 mg/dL (ref 0.40–1.50)
Calcium: 9.7 mg/dL (ref 8.4–10.5)
Chloride: 109 mEq/L (ref 96–112)
GFR: 71.96 mL/min (ref >60.00)
Glucose, Bld: 113 mg/dL — ABNORMAL HIGH (ref 70–99)
Potassium: 4.3 mEq/L (ref 3.5–5.1)
Sodium: 142 mEq/L (ref 135–145)
Total Protein: 6.7 g/dL (ref 6.0–8.3)

## 2016-12-31 LAB — CBC
HCT: 40.3 % (ref 39.0–52.0)
HEMOGLOBIN: 13.6 g/dL (ref 13.0–17.0)
MCHC: 33.8 g/dL (ref 30.0–36.0)
MCV: 89.8 fl (ref 78.0–100.0)
PLATELETS: 271 10*3/uL (ref 150.0–400.0)
RBC: 4.49 Mil/uL (ref 4.22–5.81)
RDW: 13.4 % (ref 11.5–15.5)
WBC: 5.7 10*3/uL (ref 4.0–10.5)

## 2016-12-31 LAB — PSA: PSA: 2.92 ng/mL (ref 0.10–4.00)

## 2016-12-31 LAB — HEMOGLOBIN A1C: HEMOGLOBIN A1C: 5.7 % (ref 4.6–6.5)

## 2017-01-01 ENCOUNTER — Encounter: Payer: Self-pay | Admitting: Family Medicine

## 2017-01-07 ENCOUNTER — Other Ambulatory Visit: Payer: Self-pay | Admitting: Family Medicine

## 2017-01-10 ENCOUNTER — Encounter: Payer: Self-pay | Admitting: Family Medicine

## 2017-01-31 ENCOUNTER — Other Ambulatory Visit: Payer: Self-pay | Admitting: Family Medicine

## 2017-01-31 NOTE — Telephone Encounter (Signed)
Rx approved and sent to the pharmacy by e-script.//AB/CMA 

## 2017-02-03 ENCOUNTER — Encounter: Payer: Self-pay | Admitting: Family Medicine

## 2017-02-23 ENCOUNTER — Other Ambulatory Visit: Payer: Self-pay | Admitting: Family Medicine

## 2017-05-16 ENCOUNTER — Encounter: Payer: Self-pay | Admitting: Family Medicine

## 2017-05-17 MED ORDER — BUPROPION HCL ER (XL) 300 MG PO TB24: 300.0000 mg | ORAL_TABLET | Freq: Every day | ORAL | 3 refills | Status: AC

## 2017-05-17 MED ORDER — METOPROLOL TARTRATE 25 MG PO TABS: 25.0000 mg | ORAL_TABLET | Freq: Two times a day (BID) | ORAL | 3 refills | Status: AC

## 2017-09-07 ENCOUNTER — Other Ambulatory Visit: Payer: Self-pay | Admitting: Family Medicine

## 2018-07-12 IMAGING — CT CT ABD-PELV W/ CM
2 of 5 series · 16 of 46 positions shown, 18 images · IV contrast (APPLIED)
Comparison: None.

CLINICAL DATA: Left lower quadrant pain.  Diarrhea.

EXAM:
CT ABDOMEN AND PELVIS WITH CONTRAST
TECHNIQUE: Multidetector CT imaging of the abdomen and pelvis was performed
using the standard protocol following bolus administration of
intravenous contrast.
CONTRAST:  100mL 9EBNIH-STT IOPAMIDOL (9EBNIH-STT) INJECTION 61%

[Series 2: axial st · axial · 0.98mm/px · z∈[-566,-46]mm · 13 of 118 slices shown, 15 images]
[im 7/118  soft-tissue]
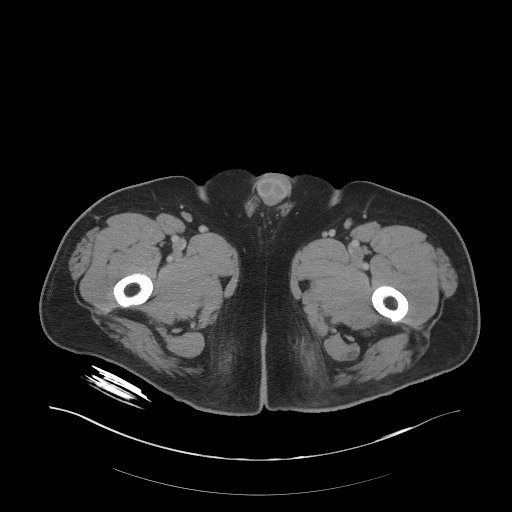
[im 7/118  bone]
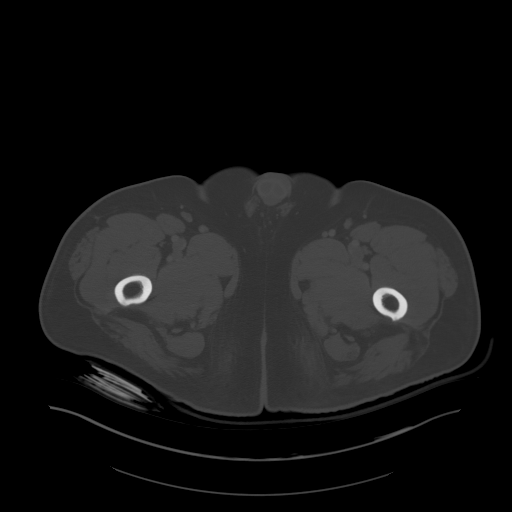
[im 19/118  soft-tissue]
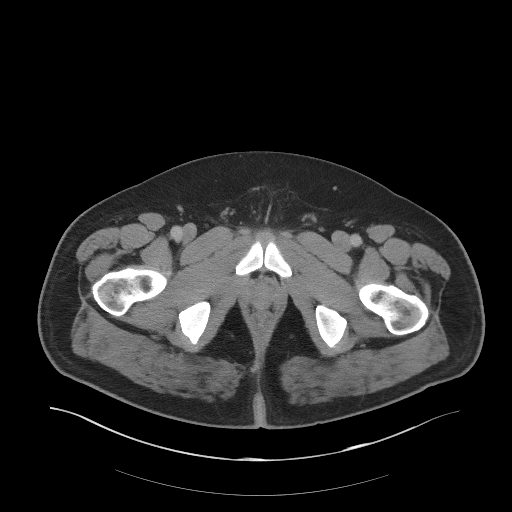
[im 25/118  soft-tissue]
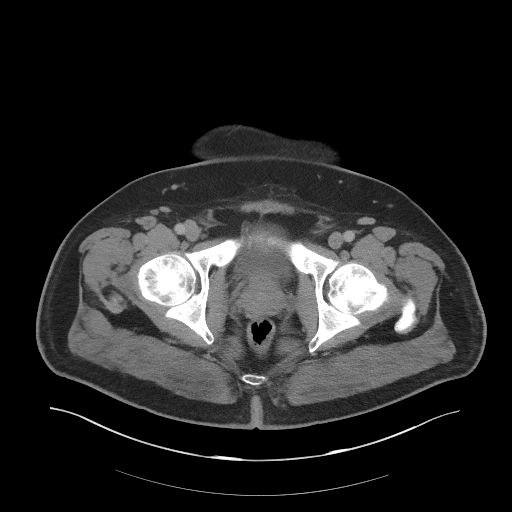
[im 31/118  soft-tissue]
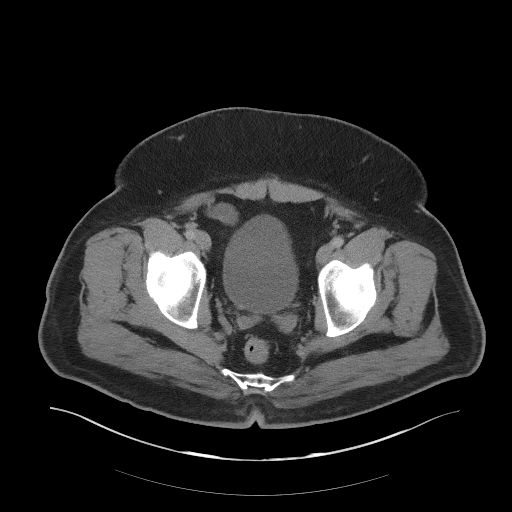
[im 44/118  soft-tissue]
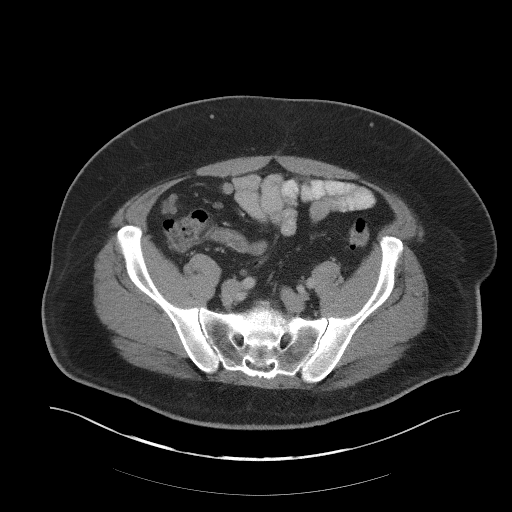
[im 50/118  soft-tissue]
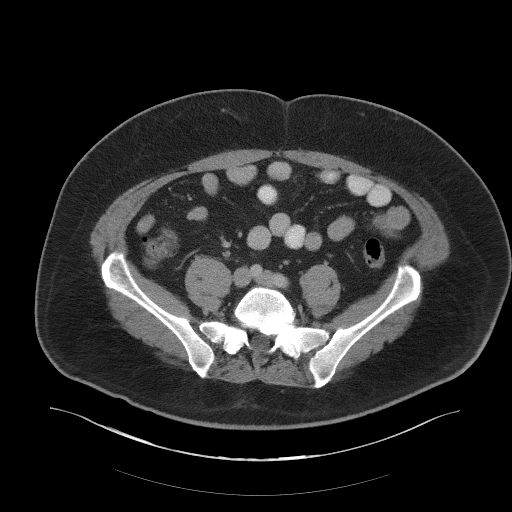
[im 62/118  soft-tissue]
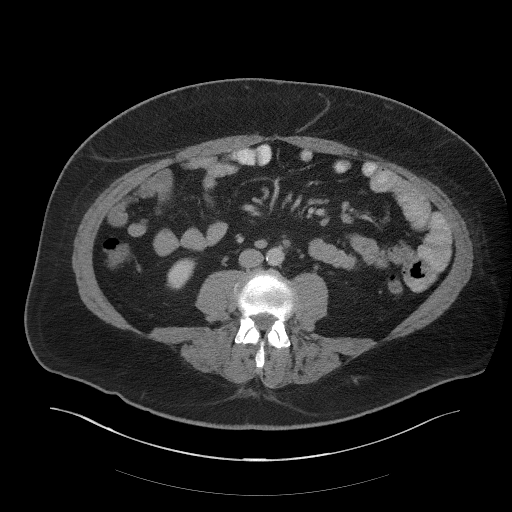
[im 68/118  soft-tissue]
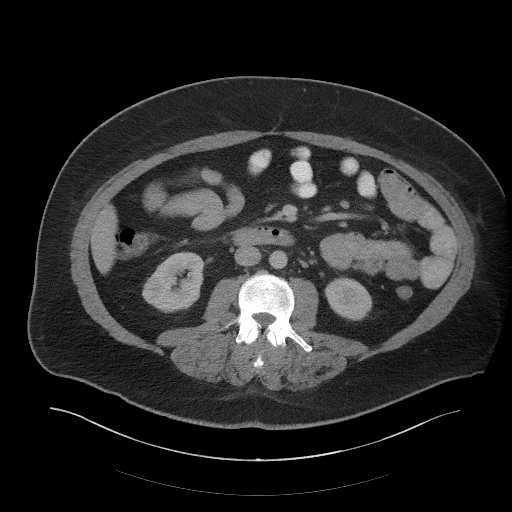
[im 74/118  soft-tissue]
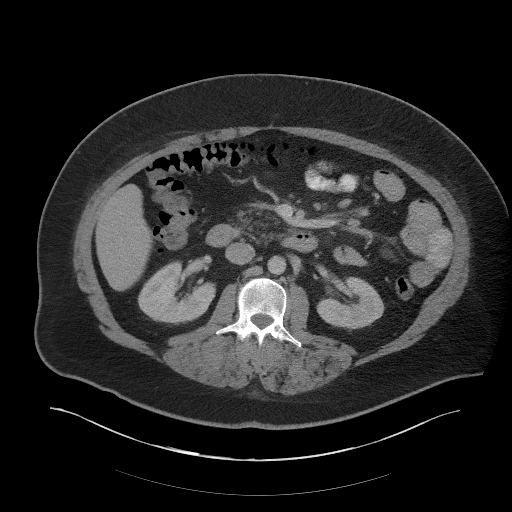
[im 74/118  bone]
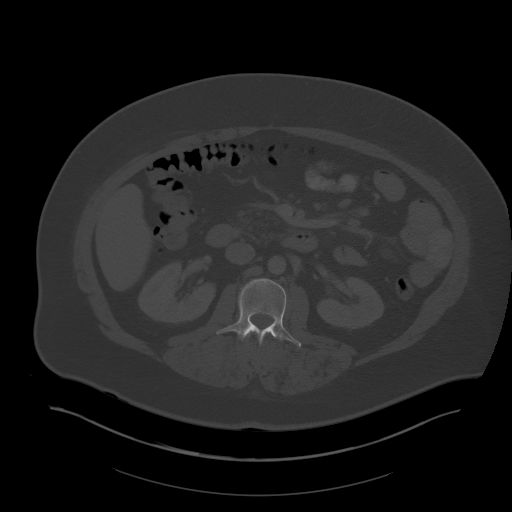
[im 87/118  soft-tissue]
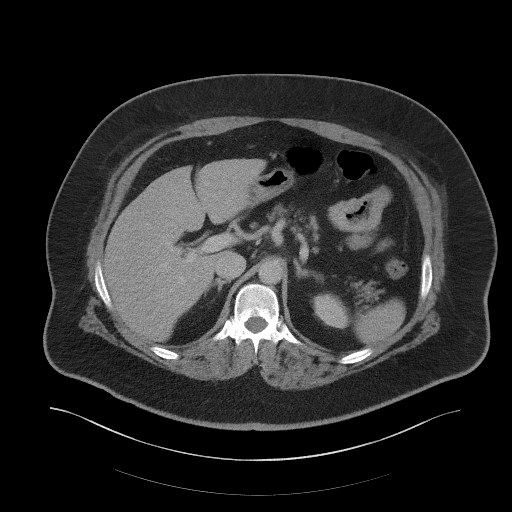
[im 93/118  soft-tissue]
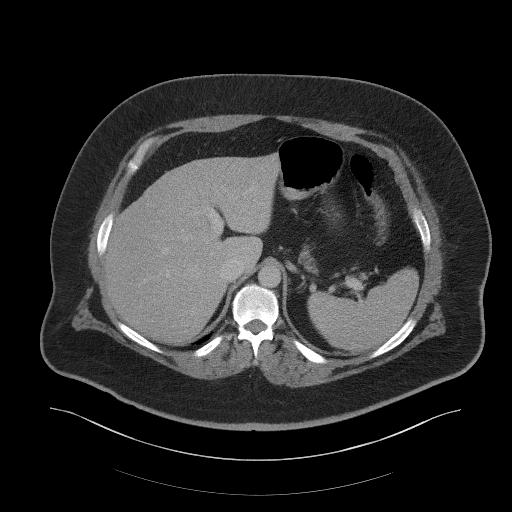
[im 99/118  soft-tissue]
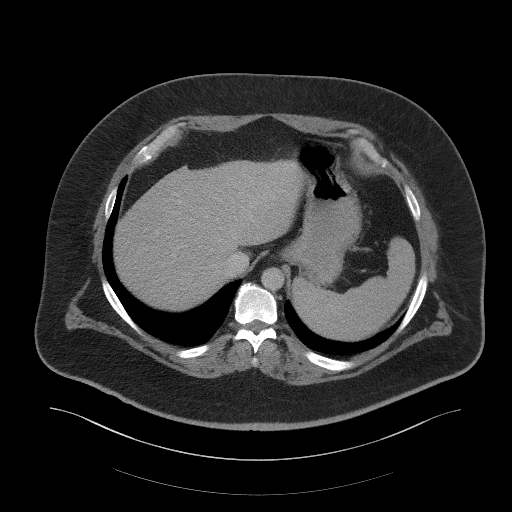
[im 111/118  soft-tissue]
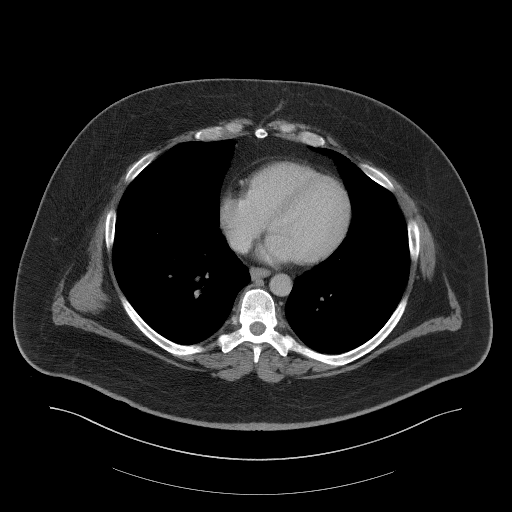

[Series 5: coronal st · coronal · 1.02mm/px · 3 of 106 slices shown]
[im 36/106  soft-tissue]
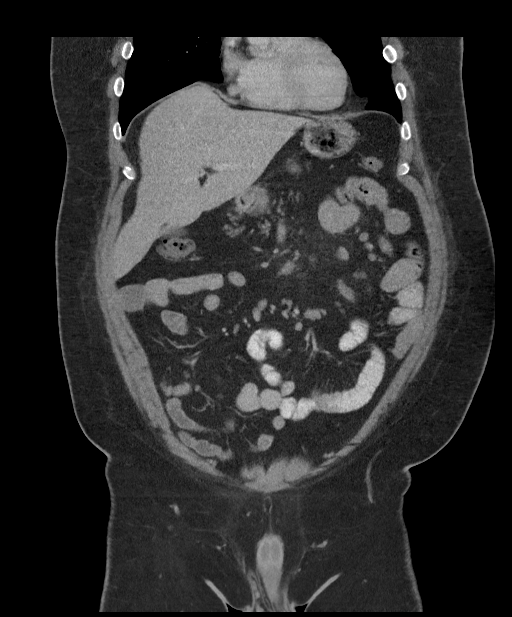
[im 47/106  soft-tissue]
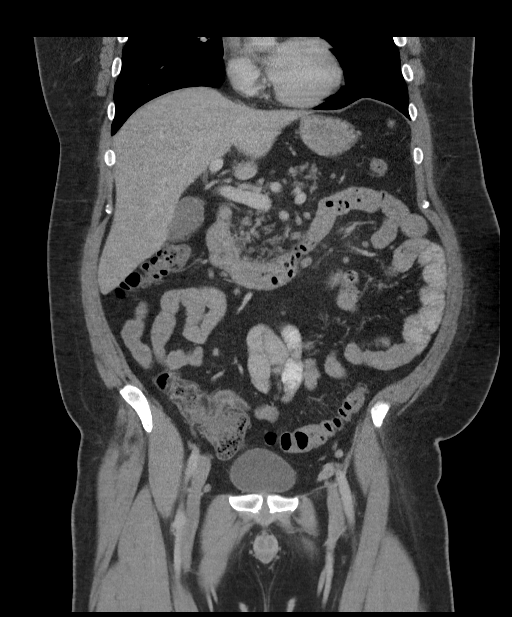
[im 59/106  soft-tissue]
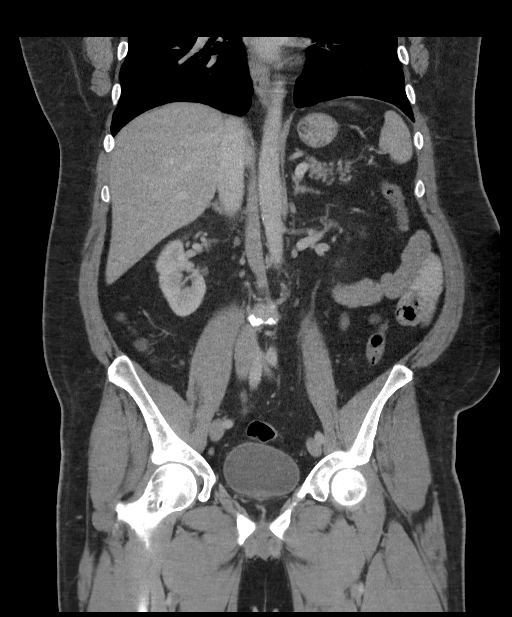

[16 of 46 positions shown; findings below may reference images not displayed]

FINDINGS: Lower chest: No acute abnormality.

Hepatobiliary: No focal liver abnormality is seen. No gallstones,
gallbladder wall thickening, or biliary dilatation.

Pancreas: Unremarkable. No pancreatic ductal dilatation or
surrounding inflammatory changes.

Spleen: Normal in size without focal abnormality.

Adrenals/Urinary Tract: Adrenal glands are unremarkable. Kidneys are
normal, without renal calculi, focal lesion, or hydronephrosis.
Bladder is unremarkable.

Stomach/Bowel: The stomach and small bowel are normal. The: And
appendix are normal.

Vascular/Lymphatic: Haziness in the mesenteric fat with numerous
mesenteric lymph nodes. A representative node in the mesenteries on
series 5, image 44 measures 13 mm in short axis. Other scattered
nodes throughout the abdomen, most normal by CT criteria. There is a
12 mm node in the right side of the abdomen on series 2, image 61
which is mildly enlarged.

Reproductive: Prostate is unremarkable.

Other: No abdominal wall hernia or abnormality. No abdominopelvic
ascites.

Musculoskeletal: No acute or significant osseous findings.
IMPRESSION: 1. No acute abnormality identified to explain left lower quadrant
pain.
2. Increased attenuation in the mesenteric fat with numerous
prominent and mildly enlarged nodes. Other shotty nodes are
identified in the abdomen with a mildly enlarged node in the
posterior right abdomen as described above. This finding is
nonspecific and could be reactive. However, a 3 to 6 month follow-up
CT scan is recommended for further evaluation.
